# Patient Record
Sex: Female | Born: 1975 | Race: White | Hispanic: No | Smoking: Current some day smoker
Health system: Southern US, Community
[De-identification: ages and names within clinical notes are randomized; demographics above are authoritative.]

## PROBLEM LIST (undated history)

## (undated) DIAGNOSIS — J9811 Atelectasis: Secondary | ICD-10-CM

## (undated) DIAGNOSIS — L409 Psoriasis, unspecified: Secondary | ICD-10-CM

## (undated) HISTORY — PX: SPLENECTOMY: SUR1306

## (undated) HISTORY — DX: Psoriasis, unspecified: L40.9

---

## 2017-04-27 ENCOUNTER — Encounter (HOSPITAL_COMMUNITY): Payer: Self-pay | Admitting: Family Medicine

## 2017-04-27 ENCOUNTER — Emergency Department (HOSPITAL_COMMUNITY)
Admission: EM | Admit: 2017-04-27 | Discharge: 2017-04-27 | Disposition: A | Discharge status: Home / Self Care | Payer: Self-pay | Attending: Emergency Medicine | Admitting: Emergency Medicine

## 2017-04-27 DIAGNOSIS — B9689 Other specified bacterial agents as the cause of diseases classified elsewhere: Secondary | ICD-10-CM | POA: Insufficient documentation

## 2017-04-27 DIAGNOSIS — N760 Acute vaginitis: Secondary | ICD-10-CM | POA: Insufficient documentation

## 2017-04-27 DIAGNOSIS — F17200 Nicotine dependence, unspecified, uncomplicated: Secondary | ICD-10-CM | POA: Insufficient documentation

## 2017-04-27 HISTORY — DX: Atelectasis: J98.11

## 2017-04-27 LAB — WET PREP, GENITAL
SPERM: NONE SEEN
Trich, Wet Prep: NONE SEEN
Yeast Wet Prep HPF POC: NONE SEEN

## 2017-04-27 MED ORDER — METRONIDAZOLE 500 MG PO TABS: 500.0000 mg | ORAL_TABLET | Freq: Two times a day (BID) | ORAL | 0 refills | Status: DC

## 2017-04-27 NOTE — ED Triage Notes (Signed)
Patient reports she is experiencing vaginal discharge with an odor. Patient denies any abd pain, nausea, vomiting, diarrhea, or cramping. Symptoms started 2 days ago.

## 2017-04-27 NOTE — ED Provider Notes (Signed)
[facility]-EMERGENCY DEPT Provider Note   CSN: [id number] Arrival date & time: 04/27/17  1719     History   Chief Complaint Chief Complaint  Patient presents with  . Vaginal Discharge    HPI Sonia Warner is a 41 y.o. female who presents to the ED with vaginal discharge and odor. Patient had left over bactrim and started on it thinking that may treat BV.  The history is provided by the patient. No language interpreter was used.  Vaginal Discharge   This is a new problem. The current episode started 2 days ago. The problem occurs constantly. The problem has not changed since onset.The discharge occurs spontaneously. The discharge was thin and mucoid. She is not pregnant. Pertinent negatives include no fever, no abdominal pain, no nausea, no vomiting, no dysuria, no frequency, no genital burning, no genital itching and no genital lesions. Treatments tried: patient took Bactrim DS x 2 doses. Her past medical history is significant for PID and ovarian cysts. Her past medical history does not include ectopic pregnancy. STD: GC.    Past Medical History:  Diagnosis Date  . Collapse of left lung     There are no active problems to display for this patient.   Past Surgical History:  Procedure Laterality Date  . SPLENECTOMY      OB History    No data available       Home Medications    Prior to Admission medications   Medication Sig Start Date End Date Taking? Authorizing Provider  metroNIDAZOLE (FLAGYL) 500 MG tablet Take 1 tablet (500 mg total) by mouth 2 (two) times daily. 04/27/17   Janne NapoleonNeese, Hope M, NP    Family History History reviewed. No pertinent family history.  Social History Social History  Substance Use Topics  . Smoking status: Current Every Day Smoker    Packs/day: 0.25  . Smokeless tobacco: Never Used  . Alcohol use Yes     Comment: Once a month     Allergies   Patient has no allergy information on record.   Review of Systems Review of Systems    Constitutional: Negative for fever.  HENT: Negative.   Eyes: Negative for redness and itching.  Respiratory: Negative for cough, chest tightness and wheezing.   Cardiovascular: Negative for chest pain.  Gastrointestinal: Negative for abdominal pain, nausea and vomiting.  Genitourinary: Positive for vaginal discharge. Negative for dysuria and frequency.  Skin: Negative for rash.  Neurological: Negative for syncope and headaches.  Psychiatric/Behavioral: Nervous/anxious: hx of.      Physical Exam Updated Vital Signs BP 104/73 (BP Location: Left Arm)   Pulse 90   Temp 98.3 F (36.8 C) (Oral)   Resp 20   Wt 55.8 kg (123 lb)   LMP 04/23/2017   SpO2 99%   Physical Exam  Constitutional: She is oriented to person, place, and time. She appears well-developed and well-nourished. No distress.  HENT:  Head: Normocephalic.  Eyes: EOM are normal.  Neck: Neck supple.  Cardiovascular: Normal rate.   Pulmonary/Chest: Effort normal.  Abdominal: Soft. There is no tenderness.  Genitourinary:  Genitourinary Comments: External genitalia without lesions, frothy d/c vaginal vault. No CMT, no adnexal tenderness, uterus without palpable enlargement.   Musculoskeletal: Normal range of motion.  Neurological: She is alert and oriented to person, place, and time. No cranial nerve deficit.  Skin: Skin is warm and dry.  Psychiatric: She has a normal mood and affect.  Nursing note and vitals reviewed.    ED Treatments /  Results  Labs (all labs ordered are listed, but only abnormal results are displayed) Labs Reviewed  WET PREP, GENITAL - Abnormal; Notable for the following:       Result Value   Clue Cells Wet Prep HPF POC PRESENT (*)    WBC, Wet Prep HPF POC MANY (*)    All other components within normal limits  RPR  HIV ANTIBODY (ROUTINE TESTING)  GC/CHLAMYDIA PROBE AMP ([place]) NOT AT [facility]    Radiology No results found.  Procedures Procedures (including critical care  time)  Medications Ordered in ED Medications - No data to display   Initial Impression / Assessment and Plan / ED Course  I have reviewed the triage vital signs and the nursing notes. Pt presents with concerns due to vaginal d/c and odor.  Pt understands that they have GC/Chlamydia cultures pending and that they will need to inform all sexual partners if results return positive. Pt not concerning for PID because hemodynamically stable and no cervical motion tenderness on pelvic exam. Pt has also been treated with Flagyl for Bacterial Vaginosis. Pt has been advised to not drink alcohol while on this medication.  Patient to be discharged with instructions to follow up with OBGYN/PCP. Discussed importance of using protection when sexually active.   Final Clinical Impressions(s) / ED Diagnoses   Final diagnoses:  BV (bacterial vaginosis)    New Prescriptions New Prescriptions   METRONIDAZOLE (FLAGYL) 500 MG TABLET    Take 1 tablet (500 mg total) by mouth 2 (two) times daily.     Kerrie [place], [place] 04/27/17 2238    Jacalyn Lefevre, MD 04/27/17 2325

## 2017-04-27 NOTE — Discharge Instructions (Signed)
The cultures and blood results will not be back for 24 to 48 hours. You can look at your results in "MyChart". We will also call if any results are positive.

## 2017-04-28 LAB — GC/CHLAMYDIA PROBE AMP (~~LOC~~) NOT AT ARMC
CHLAMYDIA, DNA PROBE: NEGATIVE
NEISSERIA GONORRHEA: NEGATIVE

## 2017-04-28 LAB — HIV ANTIBODY (ROUTINE TESTING W REFLEX): HIV Screen 4th Generation wRfx: NONREACTIVE

## 2017-04-28 LAB — RPR: RPR Ser Ql: NONREACTIVE

## 2018-06-26 ENCOUNTER — Emergency Department (HOSPITAL_COMMUNITY)
Admission: EM | Admit: 2018-06-26 | Discharge: 2018-06-27 | Disposition: A | Discharge status: Home / Self Care | Payer: Self-pay | Attending: Emergency Medicine | Admitting: Emergency Medicine

## 2018-06-26 ENCOUNTER — Emergency Department (HOSPITAL_COMMUNITY): Payer: Self-pay

## 2018-06-26 ENCOUNTER — Other Ambulatory Visit: Payer: Self-pay

## 2018-06-26 DIAGNOSIS — F1910 Other psychoactive substance abuse, uncomplicated: Secondary | ICD-10-CM | POA: Insufficient documentation

## 2018-06-26 DIAGNOSIS — R7401 Elevation of levels of liver transaminase levels: Secondary | ICD-10-CM

## 2018-06-26 DIAGNOSIS — F17210 Nicotine dependence, cigarettes, uncomplicated: Secondary | ICD-10-CM | POA: Insufficient documentation

## 2018-06-26 DIAGNOSIS — D509 Iron deficiency anemia, unspecified: Secondary | ICD-10-CM | POA: Insufficient documentation

## 2018-06-26 DIAGNOSIS — R740 Nonspecific elevation of levels of transaminase and lactic acid dehydrogenase [LDH]: Secondary | ICD-10-CM | POA: Insufficient documentation

## 2018-06-26 DIAGNOSIS — F419 Anxiety disorder, unspecified: Secondary | ICD-10-CM | POA: Insufficient documentation

## 2018-06-26 MED ORDER — SODIUM CHLORIDE 0.9 % IV SOLN
INTRAVENOUS | Status: DC
  Administered 2018-06-27: via INTRAVENOUS

## 2018-06-26 MED ORDER — LORAZEPAM 1 MG PO TABS
2.0000 mg | ORAL_TABLET | Freq: Once | ORAL | Status: AC
  Administered 2018-06-27: 2 mg via ORAL
  Filled 2018-06-26: qty 2

## 2018-06-26 MED ORDER — SODIUM CHLORIDE 0.9 % IV BOLUS
2000.0000 mL | Freq: Once | INTRAVENOUS | Status: AC
  Administered 2018-06-27: 2000 mL via INTRAVENOUS

## 2018-06-26 NOTE — ED Triage Notes (Addendum)
[place] [facility] [place] with c/o of left leg. Pt states that her leg feels numbness and tingling for 12 hours. Pt also stated to [facility] that she shot Heroin in her right arm about 2 hours prior to leg pain. Pt is lethargic and pupils are pinpoint. Pt is A&O x 3 and ambulatory.

## 2018-06-26 NOTE — ED Notes (Signed)
RN at bedside

## 2018-06-26 NOTE — ED Provider Notes (Signed)
[facility]-EMERGENCY DEPT Provider Note   CSN: [id number] Arrival date & time: 06/26/18  1749     History   Chief Complaint Chief Complaint  Patient presents with  . Leg Pain    HPI Sonia Warner is a 42 y.o. female.  43 year old female presents with sudden onset of left leg tingling which is been persistent throughout the day.  States that now is gone to her left arm.  States he feels as if her leg went to sleep.  She denies any associated back pain.  No hip discomfort.  No bowel or bladder dysfunction.  No fever or chills.  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  Denies any psychiatric complaints.  She has not had any headaches.  Feels that she is dehydrated.  But denies any emesis.  No chronic use of alcohol.  No recent history of trauma.  States she feels very anxious at this time     Past Medical History:  Diagnosis Date  . Collapse of left lung     There are no active problems to display for this patient.   Past Surgical History:  Procedure Laterality Date  . SPLENECTOMY       OB History   None      Home Medications    Prior to Admission medications   Medication Sig Start Date End Date Taking? Authorizing Provider  metroNIDAZOLE (FLAGYL) 500 MG tablet Take 1 tablet (500 mg total) by mouth 2 (two) times daily. 04/27/17   Janne Napoleon, NP    Family History No family history on file.  Social History Social History   Tobacco Use  . Smoking status: Current Every Day Smoker    Packs/day: 0.25  . Smokeless tobacco: Never Used  Substance Use Topics  . Alcohol use: Yes    Comment: Once a month  . 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 Last used: Yesterday.      Allergies   Patient has no allergy information on record.   Review of Systems Review of Systems  All other systems reviewed and are negative.    Physical Exam Updated Vital Signs BP (!) 103/56   Pulse 98   Temp  98 F (36.7 C) (Oral)   Resp 18   Ht 1.702 m (5\' 7" )   Wt 59 kg   SpO2 96%   BMI 20.36 kg/m   Physical Exam  Constitutional: She is oriented to person, place, and time. She appears well-developed and well-nourished.  Non-toxic appearance. No distress.  HENT:  Head: Normocephalic and atraumatic.  Eyes: Pupils are equal, round, and reactive to light. Conjunctivae, EOM and lids are normal.  Neck: Normal range of motion. Neck supple. No tracheal deviation present. No thyroid mass present.  Cardiovascular: Normal rate, regular rhythm and normal heart sounds. Exam reveals no gallop.  No murmur heard. Pulmonary/Chest: Effort normal and breath sounds normal. No stridor. No respiratory distress. She has no decreased breath sounds. She has no wheezes. She has no rhonchi. She has no rales.  Abdominal: Soft. Normal appearance and bowel sounds are normal. She exhibits no distension. There is no tenderness. There is no rebound and no CVA tenderness.  Musculoskeletal: Normal range of motion. She exhibits no edema or tenderness.  Neurological: She is alert and oriented to person, place, and time. She has normal strength. She displays no tremor. No cranial nerve deficit or sensory deficit. Coordination normal. GCS eye subscore  is 4. GCS verbal subscore is 5. GCS motor subscore is 6.  Strength is 5 out of 5 in her upper as well as lower extremities.  No facial asymmetry noted.  Patient without foot drop when ambulating in the department.  Sensation is grossly normal bilaterally.  Skin: Skin is warm and dry. No abrasion and no rash noted.  Psychiatric: Her mood appears anxious. Her speech is rapid and/or pressured. She is hyperactive. She expresses no suicidal plans and no homicidal plans.  Nursing note and vitals reviewed.    ED Treatments / Results  Labs (all labs ordered are listed, but only abnormal results are displayed) Labs Reviewed - No data to display  EKG None  Radiology No results  found.  Procedures Procedures (including critical care time)  Medications Ordered in ED Medications  sodium chloride 0.9 % bolus 2,000 mL (has no administration in time range)  0.9 %  sodium chloride infusion (has no administration in time range)     Initial Impression / Assessment and Plan / ED Course  I have reviewed the triage vital signs and the nursing notes.  Pertinent labs & imaging results that were available during my care of the patient were reviewed by me and considered in my medical decision making (see chart for details).     Patient given IV fluids here.  She has no focal neurological deficits on exam.  Head CT without acute findings.  Labs are pending at this time.  Given Ativan for anxiety.  Case signed out to Dr. Read Drivers  Final Clinical Impressions(s) / ED Diagnoses   Final diagnoses:  None    ED Discharge Orders    None       Lorre Nick, MD 06/26/18 2356

## 2018-06-27 LAB — SEDIMENTATION RATE: Sed Rate: 3 mm/hr (ref 0–22)

## 2018-06-27 LAB — COMPREHENSIVE METABOLIC PANEL
ALBUMIN: 3.8 g/dL (ref 3.5–5.0)
ALT: 202 U/L — AB (ref 0–44)
AST: 203 U/L — ABNORMAL HIGH (ref 15–41)
Alkaline Phosphatase: 82 U/L (ref 38–126)
Anion gap: 6 (ref 5–15)
BUN: 16 mg/dL (ref 6–20)
CO2: 26 mmol/L (ref 22–32)
CREATININE: 0.7 mg/dL (ref 0.44–1.00)
Calcium: 9 mg/dL (ref 8.9–10.3)
Chloride: 102 mmol/L (ref 98–111)
GFR calc Af Amer: >60 mL/min (ref >60)
GFR calc non Af Amer: >60 mL/min (ref >60)
GLUCOSE: 93 mg/dL (ref 70–99)
Potassium: 3.9 mmol/L (ref 3.5–5.1)
SODIUM: 134 mmol/L — AB (ref 135–145)
Total Bilirubin: 0.6 mg/dL (ref 0.3–1.2)
Total Protein: 7.4 g/dL (ref 6.5–8.1)

## 2018-06-27 LAB — CBC WITH DIFFERENTIAL/PLATELET
ABS IMMATURE GRANULOCYTES: 0.09 10*3/uL — AB (ref 0.00–0.07)
BASOS ABS: 0.1 10*3/uL (ref 0.0–0.1)
Basophils Relative: 0 %
EOS ABS: 0.2 10*3/uL (ref 0.0–0.5)
EOS PCT: 1 %
HCT: 31.3 % — ABNORMAL LOW (ref 36.0–46.0)
HEMOGLOBIN: 9.3 g/dL — AB (ref 12.0–15.0)
Immature Granulocytes: 1 %
LYMPHS ABS: 2.6 10*3/uL (ref 0.7–4.0)
LYMPHS PCT: 15 %
MCH: 22.7 pg — AB (ref 26.0–34.0)
MCHC: 29.7 g/dL — ABNORMAL LOW (ref 30.0–36.0)
MCV: 76.5 fL — ABNORMAL LOW (ref 80.0–100.0)
MONOS PCT: 12 %
Monocytes Absolute: 2 10*3/uL — ABNORMAL HIGH (ref 0.1–1.0)
Neutro Abs: 12.1 10*3/uL — ABNORMAL HIGH (ref 1.7–7.7)
Neutrophils Relative %: 71 %
Platelets: 488 10*3/uL — ABNORMAL HIGH (ref 150–400)
RBC: 4.09 MIL/uL (ref 3.87–5.11)
RDW: 24.1 % — ABNORMAL HIGH (ref 11.5–15.5)
WBC: 16.9 10*3/uL — ABNORMAL HIGH (ref 4.0–10.5)
nRBC: 0 % (ref 0.0–0.2)

## 2018-06-27 LAB — I-STAT BETA HCG BLOOD, ED (MC, WL, AP ONLY): I-stat hCG, quantitative: <5 m[IU]/mL (ref <5)

## 2018-06-27 MED ORDER — FERROUS SULFATE 325 (65 FE) MG PO TABS: 325.0000 mg | ORAL_TABLET | Freq: Two times a day (BID) | ORAL | 0 refills | Status: DC

## 2018-06-27 NOTE — ED Notes (Signed)
Patient still refusing to leave. Told patient that she had been discharged and needed to leave. Previous nurse also told patient this and told her she would be escorted by security if she would not leave. Patient back in bed with blanket over her. Security called.

## 2018-06-27 NOTE — ED Provider Notes (Signed)
Nursing notes and vitals signs, including pulse oximetry, reviewed.  Summary of this visit's results, reviewed by myself:  EKG:  EKG Interpretation  Date/Time:    Ventricular Rate:    PR Interval:    QRS Duration:   QT Interval:    QTC Calculation:   R Axis:     Text Interpretation:         Labs:  Results for orders placed or performed during the hospital encounter of 06/26/18 (from the past 24 hour(s))  CBC with Differential/Platelet     Status: Abnormal   Collection Time: 06/27/18 12:10 AM  Result Value Ref Range   WBC 16.9 (H) 4.0 - 10.5 K/uL   RBC 4.09 3.87 - 5.11 MIL/uL   Hemoglobin 9.3 (L) 12.0 - 15.0 g/dL   HCT 16.131.3 (L) 09.636.0 - 04.546.0 %   MCV 76.5 (L) 80.0 - 100.0 fL   MCH 22.7 (L) 26.0 - 34.0 pg   MCHC 29.7 (L) 30.0 - 36.0 g/dL   RDW 40.924.1 (H) 81.111.5 - 91.415.5 %   Platelets 488 (H) 150 - 400 K/uL   nRBC 0.0 0.0 - 0.2 %   Neutrophils Relative % 71 %   Neutro Abs 12.1 (H) 1.7 - 7.7 K/uL   Lymphocytes Relative 15 %   Lymphs Abs 2.6 0.7 - 4.0 K/uL   Monocytes Relative 12 %   Monocytes Absolute 2.0 (H) 0.1 - 1.0 K/uL   Eosinophils Relative 1 %   Eosinophils Absolute 0.2 0.0 - 0.5 K/uL   Basophils Relative 0 %   Basophils Absolute 0.1 0.0 - 0.1 K/uL   RBC Morphology ELLIPTOCYTES PRESENT    Immature Granulocytes 1 %   Abs Immature Granulocytes 0.09 (H) 0.00 - 0.07 K/uL   Target Cells PRESENT   Comprehensive metabolic panel     Status: Abnormal   Collection Time: 06/27/18 12:10 AM  Result Value Ref Range   Sodium 134 (L) 135 - 145 mmol/L   Potassium 3.9 3.5 - 5.1 mmol/L   Chloride 102 98 - 111 mmol/L   CO2 26 22 - 32 mmol/L   Glucose, Bld 93 70 - 99 mg/dL   BUN 16 6 - 20 mg/dL   Creatinine, Ser 7.820.70 0.44 - 1.00 mg/dL   Calcium 9.0 8.9 - 95.610.3 mg/dL   Total Protein 7.4 6.5 - 8.1 g/dL   Albumin 3.8 3.5 - 5.0 g/dL   AST 213203 (H) 15 - 41 U/L   ALT 202 (H) 0 - 44 U/L   Alkaline Phosphatase 82 38 - 126 U/L   Total Bilirubin 0.6 0.3 - 1.2 mg/dL   GFR calc non Af Amer  >60 >60 mL/min   GFR calc Af Amer >60 >60 mL/min   Anion gap 6 5 - 15  Sedimentation rate     Status: None   Collection Time: 06/27/18 12:10 AM  Result Value Ref Range   Sed Rate 3 0 - 22 mm/hr  I-Stat beta hCG blood, ED ([facility], [facility], [facility] only)     Status: None   Collection Time: 06/27/18 12:20 AM  Result Value Ref Range   I-stat hCG, quantitative <5.0 <5 mIU/mL   Comment 3            Imaging Studies: Ct Head Wo Contrast  Result Date: 06/26/2018 CLINICAL DATA:  Altered LOC numb left leg EXAM: CT HEAD WITHOUT CONTRAST TECHNIQUE: Contiguous axial images were obtained from the base of the skull through the vertex without intravenous contrast. COMPARISON:  None. FINDINGS:  Brain: No evidence of acute infarction, hemorrhage, hydrocephalus, extra-axial collection or mass lesion/mass effect. Vascular: No hyperdense vessel or unexpected calcification. Skull: Normal. Negative for fracture or focal lesion. Sinuses/Orbits: Mucosal thickening in the ethmoid and maxillary sinuses. Other: None IMPRESSION: Negative non contrasted CT appearance of the brain. Electronically Signed   By: Jasmine Pang M.D.   On: 06/26/2018 23:13      Sharnell Knight, Jonny Ruiz, MD 06/27/18 [id number]

## 2018-06-27 NOTE — ED Notes (Signed)
Patient escorted out by security

## 2018-09-30 ENCOUNTER — Emergency Department (HOSPITAL_COMMUNITY): Payer: Self-pay

## 2018-09-30 ENCOUNTER — Emergency Department (HOSPITAL_COMMUNITY)
Admission: EM | Admit: 2018-09-30 | Discharge: 2018-09-30 | Disposition: A | Discharge status: Home / Self Care | Payer: Self-pay | Attending: Emergency Medicine | Admitting: Emergency Medicine

## 2018-09-30 ENCOUNTER — Other Ambulatory Visit: Payer: Self-pay

## 2018-09-30 ENCOUNTER — Encounter (HOSPITAL_COMMUNITY): Payer: Self-pay | Admitting: Emergency Medicine

## 2018-09-30 DIAGNOSIS — Y929 Unspecified place or not applicable: Secondary | ICD-10-CM | POA: Insufficient documentation

## 2018-09-30 DIAGNOSIS — Y9351 Activity, roller skating (inline) and skateboarding: Secondary | ICD-10-CM | POA: Insufficient documentation

## 2018-09-30 DIAGNOSIS — S0993XA Unspecified injury of face, initial encounter: Secondary | ICD-10-CM

## 2018-09-30 DIAGNOSIS — Y999 Unspecified external cause status: Secondary | ICD-10-CM | POA: Insufficient documentation

## 2018-09-30 DIAGNOSIS — S00502A Unspecified superficial injury of oral cavity, initial encounter: Secondary | ICD-10-CM | POA: Insufficient documentation

## 2018-09-30 DIAGNOSIS — Z79899 Other long term (current) drug therapy: Secondary | ICD-10-CM | POA: Insufficient documentation

## 2018-09-30 DIAGNOSIS — S02609A Fracture of mandible, unspecified, initial encounter for closed fracture: Secondary | ICD-10-CM

## 2018-09-30 DIAGNOSIS — F17210 Nicotine dependence, cigarettes, uncomplicated: Secondary | ICD-10-CM | POA: Insufficient documentation

## 2018-09-30 DIAGNOSIS — S0181XA Laceration without foreign body of other part of head, initial encounter: Secondary | ICD-10-CM

## 2018-09-30 MED ORDER — LIDOCAINE HCL (PF) 1 % IJ SOLN
10.0000 mL | Freq: Once | INTRAMUSCULAR | Status: AC
  Administered 2018-09-30: 10 mL
  Filled 2018-09-30: qty 10

## 2018-09-30 MED ORDER — LIDOCAINE-EPINEPHRINE-TETRACAINE (LET) SOLUTION
3.0000 mL | Freq: Once | NASAL | Status: AC
  Administered 2018-09-30: 3 mL via TOPICAL
  Filled 2018-09-30: qty 3
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
## 2018-09-30 NOTE — Discharge Instructions (Addendum)
Please follow-up for suture removal at either urgent care ,the emergency department, or your primary care doctor in 5 days.  Please follow-up with a dentist and a maxillofacial surgeon in regards to your injuries today.  Please return to the emergency room immediately if you experience any new or worsening symptoms or any symptoms that indicate worsening infection such as fevers, increased redness/swelling/pain, warmth, or drainage from the affected area.
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
## 2019-09-17 ENCOUNTER — Telehealth: Payer: Self-pay | Admitting: Pharmacy Technician

## 2019-09-17 ENCOUNTER — Other Ambulatory Visit: Payer: Self-pay

## 2019-09-17 ENCOUNTER — Encounter: Payer: Self-pay | Admitting: Family

## 2019-09-17 ENCOUNTER — Ambulatory Visit (INDEPENDENT_AMBULATORY_CARE_PROVIDER_SITE_OTHER): Payer: Self-pay | Admitting: Family

## 2019-09-17 VITALS — BP 109/72 | HR 83 | Temp 98.5°F | Ht 67.0 in | Wt 133.0 lb

## 2019-09-17 DIAGNOSIS — B182 Chronic viral hepatitis C: Secondary | ICD-10-CM | POA: Insufficient documentation

## 2019-09-17 DIAGNOSIS — F1910 Other psychoactive substance abuse, uncomplicated: Secondary | ICD-10-CM | POA: Insufficient documentation

## 2019-09-17 DIAGNOSIS — R768 Other specified abnormal immunological findings in serum: Secondary | ICD-10-CM

## 2019-09-17 NOTE — Telephone Encounter (Signed)
[facility] Patient Advocate Encounter ° °Insurance verification completed.   ° °The patient is uninsured and will need patient assistance for medication. ° °We can complete the application and will need to meet with the patient for signatures and income documentation. ° °Sonia Warner, CPhT °Specialty Pharmacy Patient Advocate °[facility] °Phone: [phone] °Fax:  [phone] ° ° °

## 2019-09-17 NOTE — Assessment & Plan Note (Signed)
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 Discussed importance of reducing use and seeking counseling through [facility] or independently as this increases risk for treatment failure. Will continue to monitor.
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
## 2019-09-17 NOTE — Assessment & Plan Note (Signed)
Sonia Warner is a 43 y/o female with positive Hepatitis C antibody test and likely chronic Hepatitis C. She is treatment naive and asymptomatic. She does continue to use methamphetamine but has remained sober from heroin for the past year. We discussed the nature of Hepatitis C infection including the transmission, prevention, risks if left untreated, and treatment options. Will check blood work today including Hepatitis B status, HIV status, and liver function. She is currently uninsured and will require financial assistance. She met with our pharmacy staff for medication assistance. Plan for Mavyret or Epclusa pending blood work results.  

## 2019-09-17 NOTE — Patient Instructions (Signed)
Nice to meet you.  We will check your blood work today and let you know the results.  Please apply for [facility] financial assistance and [facility] financial assistance.   Once we get your results we will get you started on treatment.   Limit acetaminophen (Tylenol) usage to no more than 2 grams (2,000 mg) per day.  Avoid alcohol.  Do not share toothbrushes or razors.  Practice safe sex to protect against transmission as well as sexually transmitted disease.    Hepatitis C Hepatitis C is a viral infection of the liver. It can lead to scarring of the liver (cirrhosis), liver failure, or liver cancer. Hepatitis C may go undetected for months or years because people with the infection may not have symptoms, or they may have only mild symptoms. What are the causes? This condition is caused by the hepatitis C virus (HCV). The virus can spread from person to person (is contagious) through:  Blood.  Childbirth. A woman who has hepatitis C can pass it to her baby during birth.  Bodily fluids, such as breast milk, tears, semen, vaginal fluids, and saliva.  Blood transfusions or organ transplants done in the [place] before 1992.  What increases the risk? The following factors may make you more likely to develop this condition:  Having contact with unclean (contaminated) needles or syringes. This may result from: ? Acupuncture. ? Tattoing. ? Body piercing. ? Injecting drugs.  Having unprotected sex with someone who is infected.  Needing treatment to filter your blood (kidney dialysis).  Having HIV (human immunodeficiency virus) or AIDS (acquired immunodeficiency syndrome).  Working in a job that involves contact with blood or bodily fluids, such as health care.  What are the signs or symptoms? Symptoms of this condition include:  Fatigue.  Loss of appetite.  Nausea.  Vomiting.  Abdominal pain.  Dark yellow urine.  Yellowish skin and eyes (jaundice).  Itchy  skin.  Clay-colored bowel movements.  Joint pain.  Bleeding and bruising easily.  Fluid building up in your stomach (ascites).  In some cases, you may not have any symptoms. How is this diagnosed? This condition is diagnosed with:  Blood tests.  Other tests to check how well your liver is functioning. They may include: ? Magnetic resonance elastography (MRE). This imaging test uses MRIs and sound waves to measure liver stiffness. ? Transient elastography. This imaging test uses ultrasounds to measure liver stiffness. ? Liver biopsy. This test requires taking a small tissue sample from your liver to examine it under a microscope.  How is this treated? Your health care provider may perform noninvasive tests or a liver biopsy to help decide the best course of treatment. Treatment may include:  Antiviral medicines and other medicines.  Follow-up treatments every 6-12 months for infections or other liver conditions.  Receiving a donated liver (liver transplant).  Follow these instructions at home: Medicines  Take over-the-counter and prescription medicines only as told by your health care provider.  Take your antiviral medicine as told by your health care provider. Do not stop taking the antiviral even if you start to feel better.  Do not take any medicines unless approved by your health care provider, including over-the-counter medicines and birth control pills. Activity  Rest as needed.  Do not have sex unless approved by your health care provider.  Ask your health care provider when you may return to school or work. Eating and drinking  Eat a balanced diet with plenty of fruits and vegetables,  whole grains, and lowfat (lean) meats or non-meat proteins (such as beans or tofu).  Drink enough fluids to keep your urine clear or pale yellow.  Do not drink alcohol. General instructions  Do not share toothbrushes, nail clippers, or razors.  Wash your hands frequently  with soap and water. If soap and water are not available, use hand sanitizer.  Cover any cuts or open sores on your skin to prevent spreading the virus.  Keep all follow-up visits as told by your health care provider. This is important. You may need follow-up visits every 6-12 months. How is this prevented? There is no vaccine for hepatitis C. The only way to prevent the disease is to reduce the risk of exposure to the virus. Make sure you:  Wash your hands frequently with soap and water. If soap and water are not available, use hand sanitizer.  Do not share needles or syringes.  Practice safe sex and use condoms.  Avoid handling blood or bodily fluids without gloves or other protection.  Avoid getting tattoos or piercings in shops or other locations that are not clean.  Contact a health care provider if:  You have a fever.  You develop abdominal pain.  You pass dark urine.  You pass clay-colored stools.  You develop joint pain. Get help right away if:  You have increasing fatigue or weakness.  You lose your appetite.  You cannot eat or drink without vomiting.  You develop jaundice or your jaundice gets worse.  You bruise or bleed easily. Summary  Hepatitis C is a viral infection of the liver. It can lead to scarring of the liver (cirrhosis), liver failure, or liver cancer.  The hepatitis C virus (HCV) causes this condition. The virus can pass from person to person (is contagious).  You should not take any medicines unless approved by your health care provider. This includes over-the-counter medicines and birth control pills. This information is not intended to replace advice given to you by your health care provider. Make sure you discuss any questions you have with your health care provider. Document Released: 07/30/2000 Document Revised: 09/07/2016 Document Reviewed: 09/07/2016 Elsevier Interactive Patient Education  [employer].

## 2019-09-17 NOTE — Telephone Encounter (Signed)
Obtained needed signatures and income documentation should a Hepatitis C medication be needed.  Netty Starring. Dimas Aguas CPhT Specialty Pharmacy Patient [facility] for Infectious Disease Phone: [phone] Fax:  [phone]

## 2019-09-20 LAB — BASIC METABOLIC PANEL
BUN: 13 mg/dL (ref 7–25)
CO2: 25 mmol/L (ref 20–32)
Calcium: 9.7 mg/dL (ref 8.6–10.2)
Chloride: 106 mmol/L (ref 98–110)
Creat: 0.59 mg/dL (ref 0.50–1.10)
Glucose, Bld: 86 mg/dL (ref 65–99)
Potassium: 3.8 mmol/L (ref 3.5–5.3)
Sodium: 138 mmol/L (ref 135–146)

## 2019-09-20 LAB — HEPATIC FUNCTION PANEL
AG Ratio: 1.2 (calc) (ref 1.0–2.5)
ALT: 132 U/L — ABNORMAL HIGH (ref 6–29)
AST: 109 U/L — ABNORMAL HIGH (ref 10–35)
Albumin: 4 g/dL (ref 3.6–5.1)
Alkaline phosphatase (APISO): 69 U/L (ref 31–125)
Bilirubin, Direct: 0.1 mg/dL (ref 0.0–0.2)
Globulin: 3.4 g/dL (calc) (ref 1.9–3.7)
Indirect Bilirubin: 0.2 mg/dL (calc) (ref 0.2–1.2)
Total Bilirubin: 0.3 mg/dL (ref 0.2–1.2)
Total Protein: 7.4 g/dL (ref 6.1–8.1)

## 2019-09-20 LAB — HEPATITIS B SURFACE ANTIBODY,QUALITATIVE: Hep B S Ab: NONREACTIVE

## 2019-09-20 LAB — LIVER FIBROSIS, FIBROTEST-ACTITEST
ALT: 131 U/L — ABNORMAL HIGH (ref 6–29)
Alpha-2-Macroglobulin: 197 mg/dL (ref 106–279)
Apolipoprotein A1: 135 mg/dL (ref 101–198)
Bilirubin: 0.3 mg/dL (ref 0.2–1.2)
Fibrosis Score: 0.23
GGT: 58 U/L — ABNORMAL HIGH (ref 3–55)
Haptoglobin: 62 mg/dL (ref 43–212)
Necroinflammat ACT Score: 0.65
Reference ID: 3254851

## 2019-09-20 LAB — CBC
HCT: 31.8 % — ABNORMAL LOW (ref 35.0–45.0)
Hemoglobin: 10.5 g/dL — ABNORMAL LOW (ref 11.7–15.5)
MCH: 27.3 pg (ref 27.0–33.0)
MCHC: 33 g/dL (ref 32.0–36.0)
MCV: 82.6 fL (ref 80.0–100.0)
MPV: 10.9 fL (ref 7.5–12.5)
Platelets: 453 10*3/uL — ABNORMAL HIGH (ref 140–400)
RBC: 3.85 10*6/uL (ref 3.80–5.10)
RDW: 15 % (ref 11.0–15.0)
WBC: 6 10*3/uL (ref 3.8–10.8)

## 2019-09-20 LAB — PROTIME-INR
INR: 1
Prothrombin Time: 10.7 s (ref 9.0–11.5)

## 2019-09-20 LAB — HEPATITIS C RNA QUANTITATIVE
HCV Quantitative Log: 6.87 Log IU/mL — ABNORMAL HIGH
HCV RNA, PCR, QN: 7400000 IU/mL — ABNORMAL HIGH

## 2019-09-20 LAB — HEPATITIS B SURFACE ANTIGEN: Hepatitis B Surface Ag: NONREACTIVE

## 2019-09-20 LAB — HIV ANTIBODY (ROUTINE TESTING W REFLEX): HIV 1&2 Ab, 4th Generation: NONREACTIVE

## 2019-09-20 LAB — HEPATITIS C GENOTYPE: HCV Genotype: 3

## 2019-10-15 ENCOUNTER — Telehealth: Payer: Self-pay | Admitting: Pharmacy Technician

## 2019-10-15 ENCOUNTER — Ambulatory Visit (INDEPENDENT_AMBULATORY_CARE_PROVIDER_SITE_OTHER): Payer: Self-pay | Admitting: Family

## 2019-10-15 ENCOUNTER — Encounter: Payer: Self-pay | Admitting: Family

## 2019-10-15 ENCOUNTER — Other Ambulatory Visit: Payer: Self-pay

## 2019-10-15 DIAGNOSIS — B182 Chronic viral hepatitis C: Secondary | ICD-10-CM

## 2019-10-15 NOTE — Telephone Encounter (Addendum)
[facility] Patient Advocate Encounter  Completed and sent [facility] application for Mavyret for this patient who is uninsured.    Status is Approved.  [facility] pharmacy will send the medication directly to the patient's home.  We will follow up for future appointments.  Netty Starring. Dimas Aguas CPhT Specialty Pharmacy Patient [facility] for Infectious Disease Phone: [phone] Fax:  [phone]
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
## 2019-10-15 NOTE — Assessment & Plan Note (Signed)
Ms. Sonia Warner has genotype 3 chronic hepatitis C with a viral load of 7.4 million and fibrosis score of F0 to F1.  We reviewed her lab work and discussed the plan of care.  She remains asymptomatic.  Start Mavyret.  She was able to speak with our pharmacy staff today regarding proper administration of Mavyret.  We will treat for 8 weeks and plan for follow-up in 4 weeks after starting treatment for recheck of viral load.

## 2019-10-15 NOTE — Patient Instructions (Signed)
Nice to see you.  We will get you started on Mavyret.  Plan for follow up in 1 month following starting medication with Cassie.  Have a great day and stay safe!

## 2019-10-16 MED ORDER — MAVYRET 100-40 MG PO TABS: 3.0000 | ORAL_TABLET | Freq: Every day | ORAL | 1 refills | Status: DC

## 2019-10-16 NOTE — Addendum Note (Signed)
Addended by: Robinette Haines on: 10/16/2019 02:31 PM   Modules accepted: Orders

## 2019-10-16 NOTE — Progress Notes (Signed)
HPI: Sonia Warner is a 44 y.o. female who presents to the [facility] pharmacy clinic for Hepatitis C follow-up.  Medication: has not started any yet  Start Date: TBD  Hepatitis C Genotype: 3  Fibrosis Score: F0/F1  Hepatitis C RNA: 7.4 million on 09/17/2019  Patient Active Problem List   Diagnosis Date Noted  . Chronic hepatitis C without hepatic coma (HCC) 09/17/2019  . Polysubstance abuse (HCC) 09/17/2019    Patient's Medications  New Prescriptions   No medications on file  Previous Medications   FERROUS SULFATE 325 (65 FE) MG TABLET    Take 1 tablet (325 mg total) by mouth 2 (two) times daily with a meal.   MULTIPLE VITAMINS-MINERALS (MULTIVITAMIN ADULT, MINERALS, PO)    Take by mouth.  Modified Medications   No medications on file  Discontinued Medications   No medications on file    Allergies: No Known Allergies  Past Medical History: Past Medical History:  Diagnosis Date  . Collapse of left lung   . Psoriasis     Social History: Social History   Socioeconomic History  . Marital status: Divorced    Spouse name: Not on file  . Number of children: Not on file  . Years of education: Not on file  . Highest education level: Not on file  Occupational History  . Not on file  Tobacco Use  . Smoking status: Current Every Day Smoker    Packs/day: 0.25    Years: 30.00    Pack years: 7.50  . Smokeless tobacco: Never Used  Substance and Sexual Activity  . Alcohol use: Yes    Comment: 1-2  month / Occasional  . Drug use: Yes    Types: Heroin, Methamphetamines    Comment: Heroin and methamphetamine   . Sexual activity: Not on file  Other Topics Concern  . Not on file  Social History Narrative  . Not on file   Social Determinants of Health   Financial Resource Strain:   . Difficulty of Paying Living Expenses: Not on file  Food Insecurity:   . Worried About Programme researcher, broadcasting/film/video in the Last Year: Not on file  . Ran Out of Food in the Last Year: Not on file   Transportation Needs:   . Lack of Transportation (Medical): Not on file  . Lack of Transportation (Non-Medical): Not on file  Physical Activity:   . Days of Exercise per Week: Not on file  . Minutes of Exercise per Session: Not on file  Stress:   . Feeling of Stress : Not on file  Social Connections:   . Frequency of Communication with Friends and Family: Not on file  . Frequency of Social Gatherings with Friends and Family: Not on file  . Attends Religious Services: Not on file  . Active Member of Clubs or Organizations: Not on file  . Attends Banker Meetings: Not on file  . Marital Status: Not on file    Labs: Hepatitis C Lab Results  Component Value Date   HCVGENOTYPE 3 09/17/2019   HCVRNAPCRQN 7,400,000 (H) 09/17/2019   FIBROSTAGE F0-F1 09/17/2019   Hepatitis B Lab Results  Component Value Date   HEPBSAB NON-REACTIVE 09/17/2019   HEPBSAG NON-REACTIVE 09/17/2019   Hepatitis A No results found for: HAV HIV Lab Results  Component Value Date   HIV NON-REACTIVE 09/17/2019   HIV Non Reactive 04/27/2017   Lab Results  Component Value Date   CREATININE 0.59 09/17/2019   CREATININE 0.70  06/27/2018   Lab Results  Component Value Date   AST 109 (H) 09/17/2019   AST 203 (H) 06/27/2018   ALT 132 (H) 09/17/2019   ALT 131 (H) 09/17/2019   ALT 202 (H) 06/27/2018   INR 1.0 09/17/2019    Assessment: Sonia Warner is here today to follow up with Sonia Warner for her chronic Hepatitis C infection.  All of her labs have returned and she will be started on Mavyret for 8 weeks through patient assistance.  Counseled patient to take all three tablets of Mavyret daily with food.  Counseled patient the need to take all three tablets together and to not separate them out during the day. Encouraged patient not to miss any doses and explained how their chance of cure could go down with each dose missed. Counseled patient on what to do if dose is missed - if it is closer to the missed  dose take immediately; if closer to next dose then skip dose and take the next dose at the usual time.   Counseled patient on common side effects such as headache, fatigue, and nausea and that these normally decrease with time. I reviewed patient medications and found no drug interactions. Discussed with patient that there are several drug interactions with Mavyret and instructed patient to call the clinic if she wishes to start a new medication during course of therapy. Also advised patient to call if she experiences any side effects. Patient will follow-up with me in the pharmacy clinic 4 weeks after starting treatment.   Plan: - Start Mavyret x 8 weeks once approved  Sonia Warner, PharmD, BCIDP, AAHIVP, CPP Clinical Pharmacist Practitioner [place] [facility] for Infectious Disease 10/16/2019, 2:28 PM

## 2019-10-24 NOTE — Telephone Encounter (Signed)
[facility] Patient Advocate Encounter  Spoke with Valla Leaver and they have tried multiple times to reach the patient unsuccessfully.  They are wanting to set up shipment of Mavyret to her home and will not be able to do so until they speak with her.  They will send her a letter asking for additional information.  I have tried to reach her also.   Netty Starring. Dimas Aguas CPhT Specialty Pharmacy Patient [facility] for Infectious Disease Phone: [phone] Fax:  [phone]

## 2019-11-01 ENCOUNTER — Telehealth: Payer: Self-pay | Admitting: Pharmacy Technician

## 2019-11-01 NOTE — Telephone Encounter (Signed)
[facility] Patient Advocate Encounter  Patient called in this afternoon to find out the steps to request her Mavyret medication. She received the calls from [facility] but was unable to connect back with them. She is no longer at the [facility] address and would like the medication shipped to the clinic for pick up. I confirmed her phone number as [phone].   Medication is estimated to arrive to the clinic Thursday, April 1st. Patient will be updated of this new timeline.

## 2019-11-19 NOTE — Telephone Encounter (Signed)
[facility] Patient Advocate Encounter  Patient's medication, Mavyret has been sent to [facility] and is ready for pick up.  A HIPPA compliant voicemail was left asking her to call and we can update her.  Netty Starring. Dimas Aguas CPhT [facility] Patient [facility] Phone: [phone] Fax:  [phone]

## 2020-01-08 ENCOUNTER — Other Ambulatory Visit: Payer: Self-pay

## 2020-01-08 ENCOUNTER — Encounter (HOSPITAL_BASED_OUTPATIENT_CLINIC_OR_DEPARTMENT_OTHER): Payer: Self-pay | Admitting: *Deleted

## 2020-01-08 ENCOUNTER — Emergency Department (HOSPITAL_BASED_OUTPATIENT_CLINIC_OR_DEPARTMENT_OTHER)
Admission: EM | Admit: 2020-01-08 | Discharge: 2020-01-08 | Disposition: A | Discharge status: Home / Self Care | Payer: Self-pay | Attending: Emergency Medicine | Admitting: Emergency Medicine

## 2020-01-08 DIAGNOSIS — K089 Disorder of teeth and supporting structures, unspecified: Secondary | ICD-10-CM | POA: Insufficient documentation

## 2020-01-08 DIAGNOSIS — F17210 Nicotine dependence, cigarettes, uncomplicated: Secondary | ICD-10-CM | POA: Insufficient documentation

## 2020-01-08 DIAGNOSIS — K0889 Other specified disorders of teeth and supporting structures: Secondary | ICD-10-CM

## 2020-01-08 MED ORDER — AMOXICILLIN 500 MG PO CAPS: 500.0000 mg | ORAL_CAPSULE | Freq: Three times a day (TID) | ORAL | 0 refills | Status: DC

## 2020-01-08 MED ORDER — AMOXICILLIN 500 MG PO CAPS
500.0000 mg | ORAL_CAPSULE | Freq: Once | ORAL | Status: AC
  Administered 2020-01-08: 500 mg via ORAL
  Filled 2020-01-08: qty 1

## 2020-01-08 NOTE — ED Provider Notes (Signed)
[facility] EMERGENCY DEPARTMENT Provider Note   CSN: [id number] Arrival date & time: 01/08/20  0004     History Chief Complaint  Patient presents with  . Dental Pain    Sonia Warner is a 44 y.o. female.  Patient presents to the emergency department for evaluation of dental pain.  Patient has had 2 days of pain on the right lower side of her jaw.  No associated fever, facial swelling.  No drainage.        Past Medical History:  Diagnosis Date  . Collapse of left lung   . Psoriasis     Patient Active Problem List   Diagnosis Date Noted  . Chronic hepatitis C without hepatic coma ([place]) 09/17/2019  . Polysubstance abuse ([place]) 09/17/2019    Past Surgical History:  Procedure Laterality Date  . SPLENECTOMY       OB History   No obstetric history on file.     Family History  Problem Relation Age of Onset  . Hypertension Mother   . Stroke Father     Social History   Tobacco Use  . Smoking status: Current Every Day Smoker    Packs/day: 0.25    Years: 30.00    Pack years: 7.50  . Smokeless tobacco: Never Used  Substance Use Topics  . Alcohol use: Yes    Comment: 1-2  month / Occasional  . Drug use: Yes    Types: Heroin, Methamphetamines    Comment: Heroin and methamphetamine     Home Medications Prior to Admission medications   Medication Sig Start Date End Date Taking? Authorizing Provider  amoxicillin (AMOXIL) 500 MG capsule Take 1 capsule (500 mg total) by mouth 3 (three) times daily. 01/08/20   Orpah Greek, MD  ferrous sulfate 325 (65 FE) MG tablet Take 1 tablet (325 mg total) by mouth 2 (two) times daily with a meal. Patient not taking: Reported on 10/15/2019 06/27/18   Molpus, John, MD  Glecaprevir-Pibrentasvir (MAVYRET) 100-40 MG TABS Take 3 tablets by mouth daily with breakfast. 10/16/19   Golden Circle, FNP  Multiple Vitamins-Minerals (MULTIVITAMIN ADULT, MINERALS, PO) Take by mouth.    [provider]     Allergies    Patient has no known allergies.  Review of Systems   Review of Systems  Constitutional: Negative for fever.  HENT: Positive for dental problem.     Physical Exam Updated Vital Signs BP 111/75   Pulse 82   Temp 98.5 F (36.9 C)   Resp 18   Ht 5\' 7"  (1.702 m)   Wt 55.8 kg   LMP 12/25/2019   SpO2 97%   BMI 19.26 kg/m   Physical Exam Vitals and nursing note reviewed.  Constitutional:      General: She is not in acute distress.    Appearance: Normal appearance. She is well-developed.  HENT:     Head: Normocephalic and atraumatic.     Right Ear: Hearing normal.     Left Ear: Hearing normal.     Nose: Nose normal.     Mouth/Throat:     Dentition: Dental tenderness and dental caries present. No dental abscesses.   Eyes:     Conjunctiva/sclera: Conjunctivae normal.     Pupils: Pupils are equal, round, and reactive to light.  Cardiovascular:     Rate and Rhythm: Regular rhythm.     Heart sounds: S1 normal and S2 normal. No murmur. No friction rub. No gallop.   Pulmonary:  Effort: Pulmonary effort is normal. No respiratory distress.     Breath sounds: Normal breath sounds.  Chest:     Chest wall: No tenderness.  Abdominal:     General: Bowel sounds are normal.     Palpations: Abdomen is soft.     Tenderness: There is no abdominal tenderness. There is no guarding or rebound. Negative signs include Murphy's sign and McBurney's sign.     Hernia: No hernia is present.  Musculoskeletal:        General: Normal range of motion.     Cervical back: Normal range of motion and neck supple.  Skin:    General: Skin is warm and dry.     Findings: No rash.  Neurological:     Mental Status: She is alert and oriented to person, place, and time.     GCS: GCS eye subscore is 4. GCS verbal subscore is 5. GCS motor subscore is 6.     Cranial Nerves: No cranial nerve deficit.     Sensory: No sensory deficit.     Coordination: Coordination normal.  Psychiatric:         Speech: Speech normal.        Behavior: Behavior normal.        Thought Content: Thought content normal.     ED Results / Procedures / Treatments   Labs (all labs ordered are listed, but only abnormal results are displayed) Labs Reviewed - No data to display  EKG None  Radiology No results found.  Procedures Procedures (including critical care time)  Medications Ordered in ED Medications  amoxicillin (AMOXIL) capsule 500 mg (has no administration in time range)    ED Course  I have reviewed the triage vital signs and the nursing notes.  Pertinent labs & imaging results that were available during my care of the patient were reviewed by me and considered in my medical decision making (see chart for details).    MDM Rules/Calculators/A&P                      Patient does have multiple dental caries with tenderness to percussion and associated dental pain in the right lower jaw.  She does not have any fluctuant or drainable abscess, no facial swelling or fever.  Will initiate empiric antibiotic coverage, needs to follow-up with dentist.  Final Clinical Impression(s) / ED Diagnoses Final diagnoses:  Pain, dental    Rx / DC Orders ED Discharge Orders         Ordered    amoxicillin (AMOXIL) 500 MG capsule  3 times daily     01/08/20 0024           Gilda Crease, MD 01/08/20 [phone]

## 2020-01-08 NOTE — ED Triage Notes (Signed)
Pt c/o dental pain x 2 days.  
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
## 2020-01-28 IMAGING — DX DG HAND COMPLETE 3+V*L*
3 series · 3 of 3 positions shown · non-contrast
Comparison: None.

CLINICAL DATA: No evidence of fracture of the carpal or metacarpal
bones. Radiocarpal joint is intact. Phalanges are normal. No soft
tissue injury.

EXAM:
LEFT HAND - COMPLETE 3+ VIEW

[hand pa]
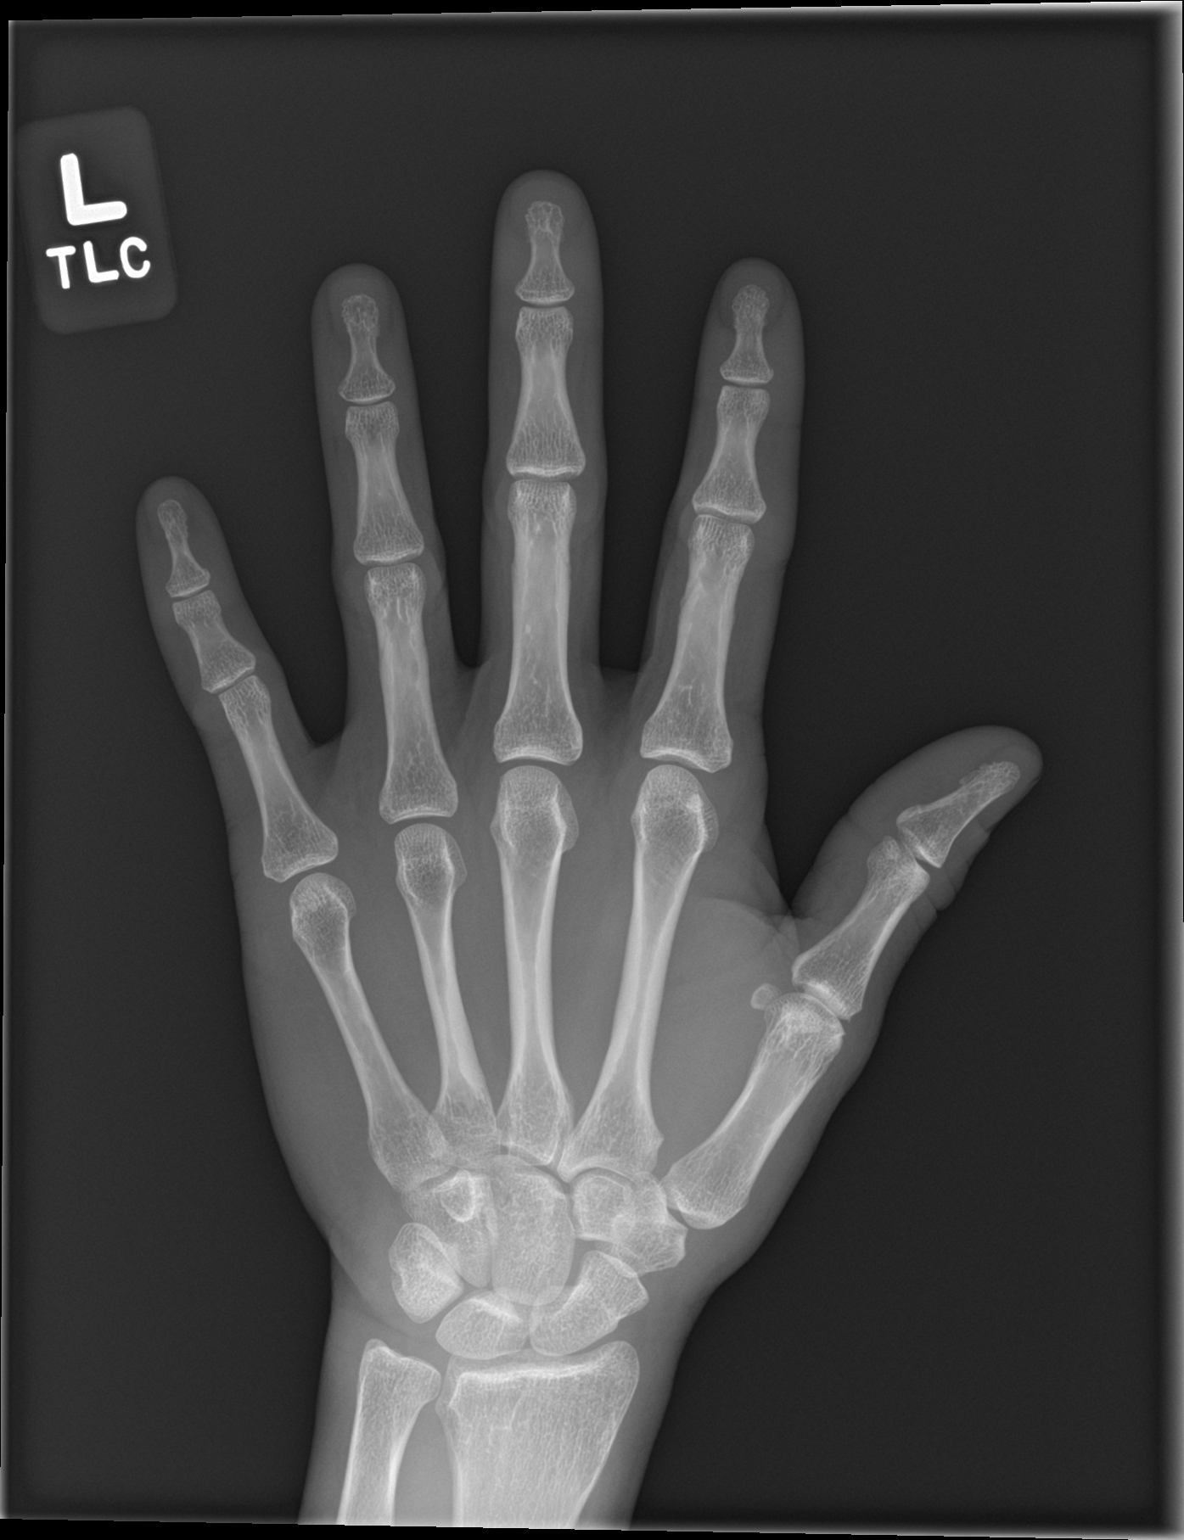

[hand obl]
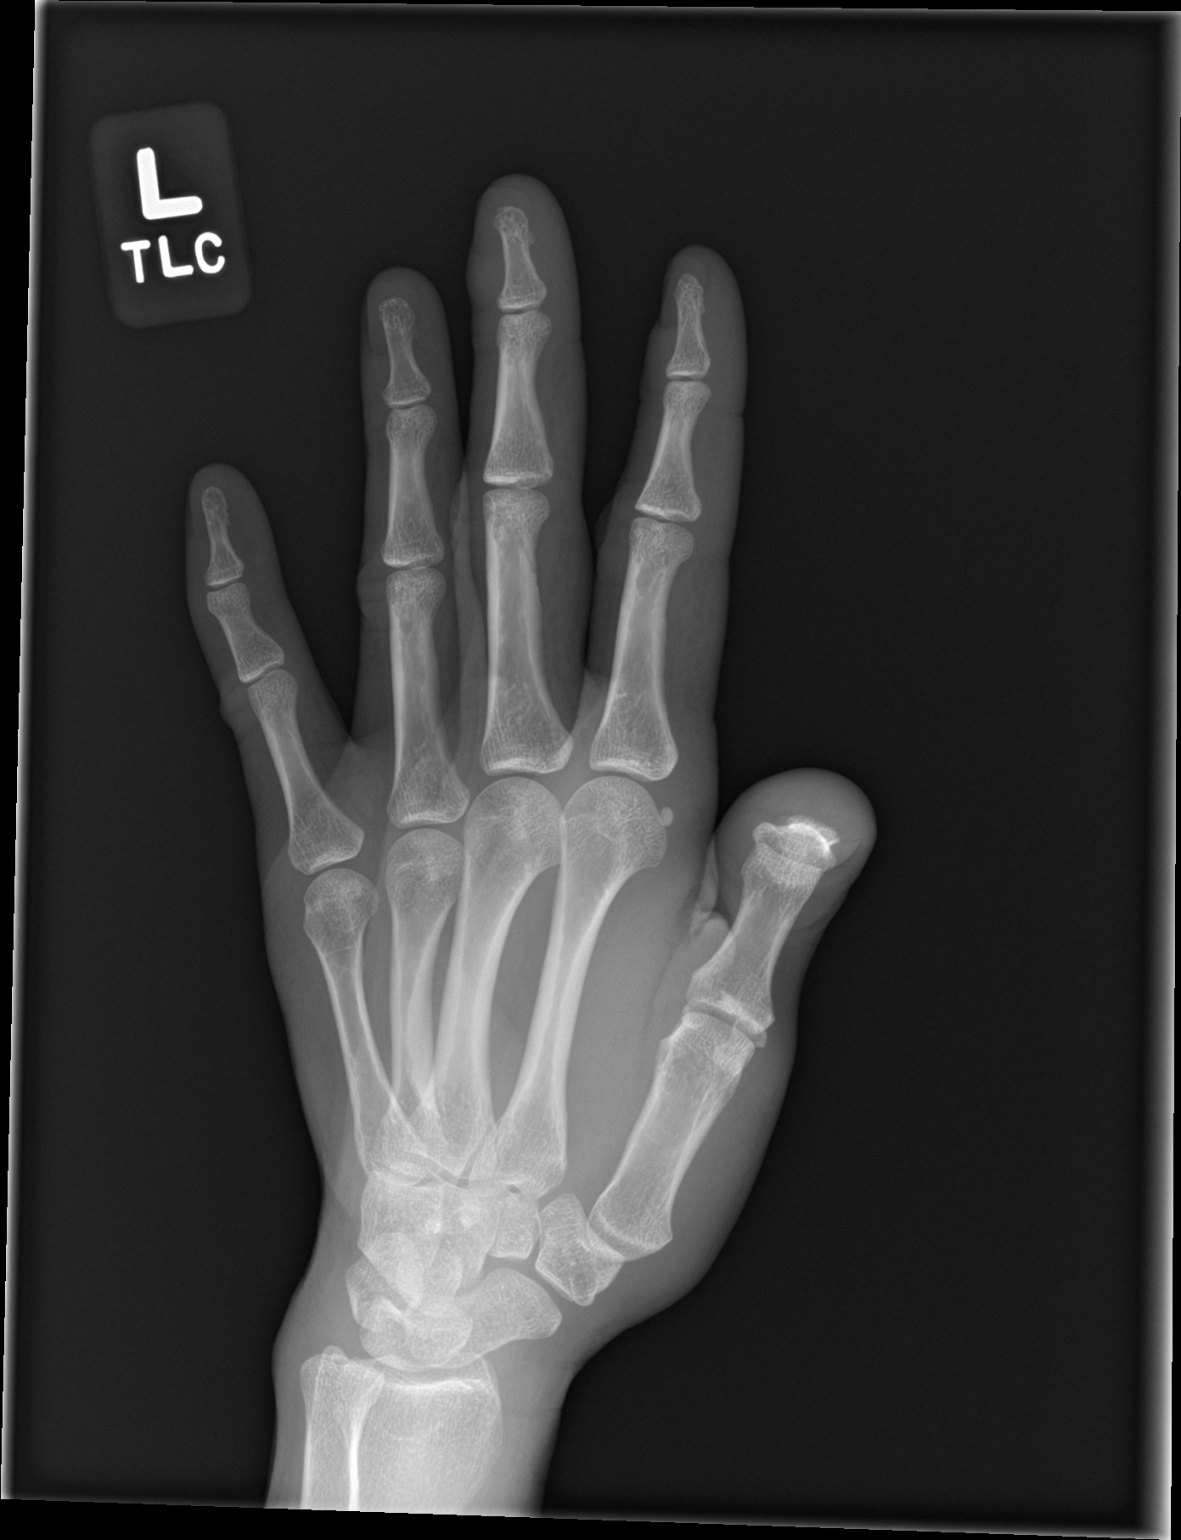

[hand lat]
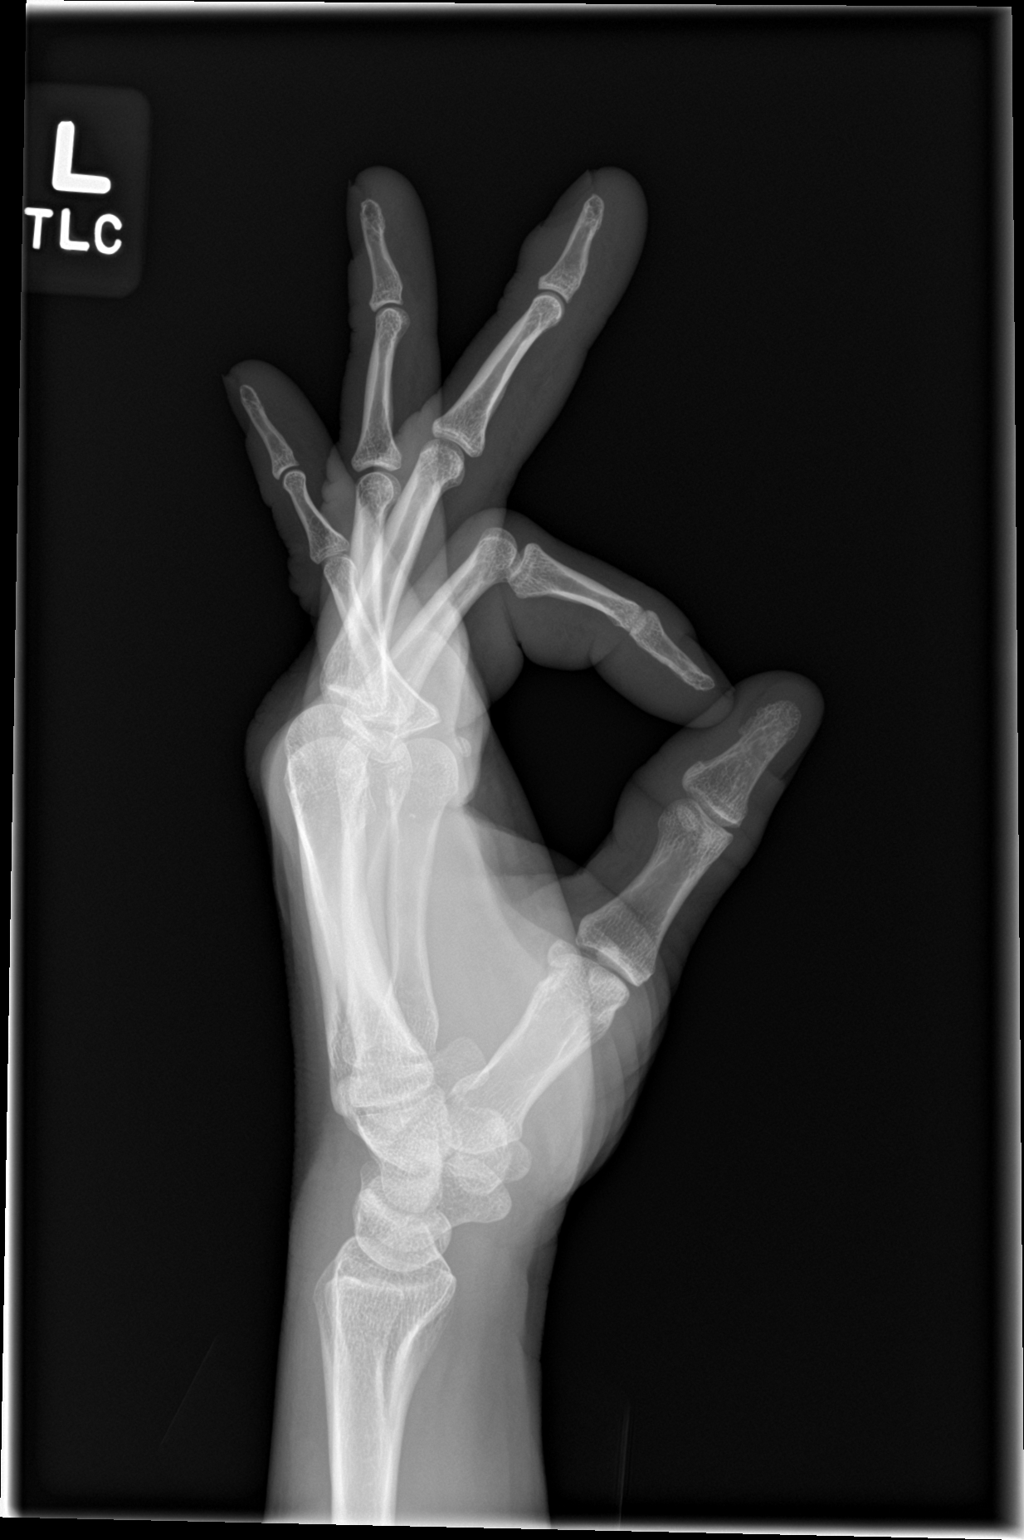

[3 of 3 positions shown; findings below may reference images not displayed]

FINDINGS: No evidence of fracture of the carpal or metacarpal bones.
Radiocarpal joint is intact. Phalanges are normal. No soft tissue
injury.
IMPRESSION: No fracture or dislocation.

## 2021-05-20 ENCOUNTER — Other Ambulatory Visit: Payer: Self-pay

## 2021-05-20 ENCOUNTER — Emergency Department (HOSPITAL_COMMUNITY)
Admission: EM | Admit: 2021-05-20 | Discharge: 2021-05-21 | Disposition: A | Discharge status: Other Acute Inpatient Hospital | Payer: Self-pay | Attending: Emergency Medicine | Admitting: Emergency Medicine

## 2021-05-20 ENCOUNTER — Encounter (HOSPITAL_COMMUNITY): Payer: Self-pay

## 2021-05-20 DIAGNOSIS — Z20822 Contact with and (suspected) exposure to covid-19: Secondary | ICD-10-CM | POA: Insufficient documentation

## 2021-05-20 DIAGNOSIS — F17210 Nicotine dependence, cigarettes, uncomplicated: Secondary | ICD-10-CM | POA: Insufficient documentation

## 2021-05-20 DIAGNOSIS — T1491XA Suicide attempt, initial encounter: Secondary | ICD-10-CM

## 2021-05-20 DIAGNOSIS — R45851 Suicidal ideations: Secondary | ICD-10-CM | POA: Insufficient documentation

## 2021-05-20 DIAGNOSIS — F1124 Opioid dependence with opioid-induced mood disorder: Secondary | ICD-10-CM | POA: Insufficient documentation

## 2021-05-20 DIAGNOSIS — F332 Major depressive disorder, recurrent severe without psychotic features: Secondary | ICD-10-CM | POA: Insufficient documentation

## 2021-05-20 DIAGNOSIS — N9489 Other specified conditions associated with female genital organs and menstrual cycle: Secondary | ICD-10-CM | POA: Insufficient documentation

## 2021-05-20 LAB — CBC WITH DIFFERENTIAL/PLATELET
Abs Immature Granulocytes: 0.02 10*3/uL (ref 0.00–0.07)
Basophils Absolute: 0.1 10*3/uL (ref 0.0–0.1)
Basophils Relative: 1 %
Eosinophils Absolute: 0.7 10*3/uL — ABNORMAL HIGH (ref 0.0–0.5)
Eosinophils Relative: 8 %
HCT: 40.2 % (ref 36.0–46.0)
Hemoglobin: 13 g/dL (ref 12.0–15.0)
Immature Granulocytes: 0 %
Lymphocytes Relative: 46 %
Lymphs Abs: 3.9 10*3/uL (ref 0.7–4.0)
MCH: 28.9 pg (ref 26.0–34.0)
MCHC: 32.3 g/dL (ref 30.0–36.0)
MCV: 89.3 fL (ref 80.0–100.0)
Monocytes Absolute: 1.1 10*3/uL — ABNORMAL HIGH (ref 0.1–1.0)
Monocytes Relative: 14 %
Neutro Abs: 2.5 10*3/uL (ref 1.7–7.7)
Neutrophils Relative %: 31 %
Platelets: 462 10*3/uL — ABNORMAL HIGH (ref 150–400)
RBC: 4.5 MIL/uL (ref 3.87–5.11)
RDW: 17.5 % — ABNORMAL HIGH (ref 11.5–15.5)
WBC: 8.3 10*3/uL (ref 4.0–10.5)
nRBC: 0 % (ref 0.0–0.2)

## 2021-05-20 LAB — I-STAT BETA HCG BLOOD, ED (MC, WL, AP ONLY): I-stat hCG, quantitative: <5 m[IU]/mL (ref <5)

## 2021-05-20 LAB — ETHANOL: Alcohol, Ethyl (B): <10 mg/dL (ref <10)
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
## 2021-05-20 NOTE — ED Provider Notes (Signed)
Emergency Medicine Provider Triage Evaluation Note  Sonia Warner , a 45 y.o. female  was evaluated in triage.  Pt complains of suicide attempt.  Patient states that she attempted to hang herself with the strap of her purse.  She states that she did not use her body weight but thinks that she passed out.  The strap apparently loosened.  She has also been taking fentanyl.  She has long history of this and has been injecting into her arms.  No difficulty breathing or significant neck pain.  No fevers or history of endocarditis per her report.  No swelling of the skin or any abscesses.  Review of Systems  Positive: Suicidal ideation Negative: Fevers  Physical Exam  BP (!) 147/95   Pulse 95   Temp 98.4 F (36.9 C) (Oral)   Resp 18   SpO2 99%  Gen:   Awake, no distress   Resp:  Normal effort  MSK:   Moves extremities without difficulty  Other:  Track marks noted without abscess, patient is tearful  Medical Decision Making  Medically screening exam initiated at 11:00 PM.  Appropriate orders placed.  Marguerette Sheller was informed that the remainder of the evaluation will be completed by another provider, this initial triage assessment does not replace that evaluation, and the importance of remaining in the ED until their evaluation is complete.  Plan: Medical screening, TTS evaluation.   Renne Crigler, PA-C 05/20/21 2302    Gloris Manchester, MD 05/22/21 1200

## 2021-05-21 ENCOUNTER — Inpatient Hospital Stay: Admission: AD | Admit: 2021-05-21 | Payer: Self-pay | Source: Home / Self Care | Admitting: Emergency Medicine

## 2021-05-21 ENCOUNTER — Other Ambulatory Visit (HOSPITAL_COMMUNITY)
Admission: EM | Admit: 2021-05-21 | Discharge: 2021-05-23 | Disposition: A | Discharge status: Home / Self Care | Payer: No Payment, Other | Attending: Family | Admitting: Family

## 2021-05-21 ENCOUNTER — Encounter (HOSPITAL_COMMUNITY): Payer: Self-pay | Admitting: Emergency Medicine

## 2021-05-21 DIAGNOSIS — X838XXA Intentional self-harm by other specified means, initial encounter: Secondary | ICD-10-CM | POA: Insufficient documentation

## 2021-05-21 DIAGNOSIS — F1910 Other psychoactive substance abuse, uncomplicated: Secondary | ICD-10-CM

## 2021-05-21 DIAGNOSIS — F419 Anxiety disorder, unspecified: Secondary | ICD-10-CM | POA: Insufficient documentation

## 2021-05-21 DIAGNOSIS — F17210 Nicotine dependence, cigarettes, uncomplicated: Secondary | ICD-10-CM | POA: Insufficient documentation

## 2021-05-21 DIAGNOSIS — T1491XA Suicide attempt, initial encounter: Secondary | ICD-10-CM | POA: Insufficient documentation

## 2021-05-21 DIAGNOSIS — F319 Bipolar disorder, unspecified: Secondary | ICD-10-CM | POA: Insufficient documentation

## 2021-05-21 DIAGNOSIS — F1090 Alcohol use, unspecified, uncomplicated: Secondary | ICD-10-CM | POA: Insufficient documentation

## 2021-05-21 DIAGNOSIS — F1914 Other psychoactive substance abuse with psychoactive substance-induced mood disorder: Secondary | ICD-10-CM | POA: Insufficient documentation

## 2021-05-21 DIAGNOSIS — F1994 Other psychoactive substance use, unspecified with psychoactive substance-induced mood disorder: Secondary | ICD-10-CM | POA: Diagnosis present

## 2021-05-21 LAB — RESP PANEL BY RT-PCR (FLU A&B, COVID) ARPGX2
Influenza A by PCR: NEGATIVE
Influenza B by PCR: NEGATIVE
SARS Coronavirus 2 by RT PCR: NEGATIVE

## 2021-05-21 LAB — COMPREHENSIVE METABOLIC PANEL
ALT: 157 U/L — ABNORMAL HIGH (ref 0–44)
AST: 188 U/L — ABNORMAL HIGH (ref 15–41)
Albumin: 3.9 g/dL (ref 3.5–5.0)
Alkaline Phosphatase: 76 U/L (ref 38–126)
Anion gap: 4 — ABNORMAL LOW (ref 5–15)
BUN: 18 mg/dL (ref 6–20)
CO2: 26 mmol/L (ref 22–32)
Calcium: 9.9 mg/dL (ref 8.9–10.3)
Chloride: 103 mmol/L (ref 98–111)
Creatinine, Ser: 0.6 mg/dL (ref 0.44–1.00)
GFR, Estimated: >60 mL/min (ref >60)
Glucose, Bld: 121 mg/dL — ABNORMAL HIGH (ref 70–99)
Potassium: 4.6 mmol/L (ref 3.5–5.1)
Sodium: 133 mmol/L — ABNORMAL LOW (ref 135–145)
Total Bilirubin: 0.6 mg/dL (ref 0.3–1.2)
Total Protein: 8.2 g/dL — ABNORMAL HIGH (ref 6.5–8.1)

## 2021-05-21 MED ORDER — LORAZEPAM 1 MG PO TABS
0.0000 mg | ORAL_TABLET | Freq: Four times a day (QID) | ORAL | Status: DC
  Filled 2021-05-21: qty 1

## 2021-05-21 MED ORDER — ONDANSETRON HCL 4 MG PO TABS
4.0000 mg | ORAL_TABLET | Freq: Three times a day (TID) | ORAL | Status: DC | PRN
Start: 2021-05-21 — End: 2021-05-21

## 2021-05-21 MED ORDER — THIAMINE HCL 100 MG/ML IJ SOLN: 100.0000 mg | Freq: Every day | INTRAMUSCULAR | Status: DC

## 2021-05-21 MED ORDER — CLONIDINE HCL 0.1 MG PO TABS
0.1000 mg | ORAL_TABLET | ORAL | Status: DC
  Administered 2021-05-23: 0.1 mg via ORAL
  Filled 2021-05-21: qty 1

## 2021-05-21 MED ORDER — LORAZEPAM 2 MG/ML IJ SOLN
0.0000 mg | Freq: Four times a day (QID) | INTRAMUSCULAR | Status: DC
  Administered 2021-05-21: 0 mg via INTRAVENOUS

## 2021-05-21 MED ORDER — LORAZEPAM 2 MG/ML IJ SOLN: 0.0000 mg | Freq: Two times a day (BID) | INTRAMUSCULAR | Status: DC

## 2021-05-21 MED ORDER — GABAPENTIN 100 MG PO CAPS
100.0000 mg | ORAL_CAPSULE | Freq: Two times a day (BID) | ORAL | Status: DC
  Administered 2021-05-21 – 2021-05-23 (×5): 100 mg via ORAL
  Filled 2021-05-21 (×5): qty 1

## 2021-05-21 MED ORDER — CLONIDINE HCL 0.1 MG PO TABS
0.1000 mg | ORAL_TABLET | Freq: Four times a day (QID) | ORAL | Status: AC
  Administered 2021-05-21 – 2021-05-22 (×7): 0.1 mg via ORAL
  Filled 2021-05-21 (×7): qty 1

## 2021-05-21 MED ORDER — NAPROXEN 500 MG PO TABS
500.0000 mg | ORAL_TABLET | Freq: Two times a day (BID) | ORAL | Status: DC | PRN
  Administered 2021-05-21 – 2021-05-22 (×3): 500 mg via ORAL
  Filled 2021-05-21 (×3): qty 1

## 2021-05-21 MED ORDER — HYDROXYZINE HCL 25 MG PO TABS
25.0000 mg | ORAL_TABLET | Freq: Four times a day (QID) | ORAL | Status: DC | PRN
  Administered 2021-05-21 – 2021-05-23 (×4): 25 mg via ORAL
  Filled 2021-05-21 (×4): qty 1

## 2021-05-21 MED ORDER — CLONIDINE HCL 0.1 MG PO TABS: 0.1000 mg | ORAL_TABLET | Freq: Every day | ORAL | Status: DC

## 2021-05-21 MED ORDER — OXCARBAZEPINE 150 MG PO TABS
75.0000 mg | ORAL_TABLET | Freq: Two times a day (BID) | ORAL | Status: DC
  Administered 2021-05-21 – 2021-05-23 (×5): 75 mg via ORAL
  Filled 2021-05-21 (×5): qty 1

## 2021-05-21 MED ORDER — DICYCLOMINE HCL 20 MG PO TABS
20.0000 mg | ORAL_TABLET | Freq: Four times a day (QID) | ORAL | Status: DC | PRN
  Administered 2021-05-22 (×2): 20 mg via ORAL
  Filled 2021-05-21 (×2): qty 1

## 2021-05-21 MED ORDER — ONDANSETRON 4 MG PO TBDP: 4.0000 mg | ORAL_TABLET | Freq: Four times a day (QID) | ORAL | Status: DC | PRN

## 2021-05-21 MED ORDER — LOPERAMIDE HCL 2 MG PO CAPS: 2.0000 mg | ORAL_CAPSULE | ORAL | Status: DC | PRN

## 2021-05-21 MED ORDER — THIAMINE HCL 100 MG PO TABS
100.0000 mg | ORAL_TABLET | Freq: Every day | ORAL | Status: DC
  Administered 2021-05-21: 100 mg via ORAL
  Filled 2021-05-21: qty 1

## 2021-05-21 MED ORDER — METHOCARBAMOL 500 MG PO TABS
500.0000 mg | ORAL_TABLET | Freq: Three times a day (TID) | ORAL | Status: DC | PRN
  Administered 2021-05-21 – 2021-05-23 (×5): 500 mg via ORAL
  Filled 2021-05-21 (×5): qty 1

## 2021-05-21 MED ORDER — LORAZEPAM 1 MG PO TABS
0.0000 mg | ORAL_TABLET | Freq: Two times a day (BID) | ORAL | Status: DC
Start: 2021-05-23 — End: 2021-05-21
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
## 2021-05-21 NOTE — Consult Note (Signed)
[id number] [facility] Face-to-Face Psychiatry Consult   Reason for Consult: Suicidal ideations Referring Physician: Dr. Lucianne Muss Patient Identification: Eliora Nienhuis MRN:  [id number] Principal Diagnosis: <principal problem not specified> Diagnosis:  Active Problems:   * No active hospital problems. *   Total Time spent with patient: 30 minutes  Subjective:   Shamra Bradeen is a 45 y.o. female patient admitted with depression, hopelessness, substance use.  Patient was admitted after she attempted to hang herself twice yesterday.  At that time patient does admit to being under the influence of fentanyl.  She reports having used fentanyl daily for the past year.  She reports a history of suicidal thoughts, no previous attempts until yesterday.  She is unable to identify any protective factors at this time.  She denies having any support system, religious preferences.  She also reports she is not involved in any volatile relationship, and her significant other is not supportive.  She does endorse and express much interest into seeking help, and shows insight into her underlying mental condition.  She also is very tearful during assessment and her physical presentation is consistent with depression and anxiety.  She does not appear to be responding to internal stimuli, external stimuli, or evidence of any delusional thought disorder.  She continues to endorse passive suicidal ideations, and is able to contract for safety.  HPI:  Pt had attempted to hang herself twice yesterday with the strap to a purse.  Pt also says she used fentanyl yesterday.  Pt says that she and boyfriend not getting along has been a contributing factor to her attempted suicide.  Pt says that "it got violent yesterday."  Relationship has been poor quality since the begining a year and a half ago.  Pt has had SI for the last 6 months.  Her first attempt was yesterday.  Pt is currently denying SI "I just feel sad."  Pt is tearful during assessment.  Pt  denies any HI or A/V halluciantions.  No recent outpatient care.  Past Psychiatric History: Depression, anxiety, ADD, opiate use disorder severe currently is receiving no outpatient psychiatric resources.  Currently is not taking any psychotropic medications to manage the above.  Denies any recent inpatient hospitalizations.  Denies any additional suicide attempts with the exception of yesterday.  Risk to Self: Yes Risk to Others: No Prior Inpatient Therapy: Denies Prior Outpatient Therapy: Substance abuse programming  Past Medical History:  Past Medical History:  Diagnosis Date   Collapse of left lung    Psoriasis     Past Surgical History:  Procedure Laterality Date   SPLENECTOMY     Family History:  Family History  Problem Relation Age of Onset   Hypertension Mother    Stroke Father    Family Psychiatric  History: Denies Social History:  Social History   Substance and Sexual Activity  Alcohol Use Yes   Comment: 1-2  month / Occasional     Social History   Substance and Sexual Activity  Drug Use Yes   Types: Heroin, Methamphetamines   Comment: Heroin and methamphetamine     Social History   Socioeconomic History   Marital status: Divorced    Spouse name: Not on file   Number of children: Not on file   Years of education: Not on file   Highest education level: Not on file  Occupational History   Not on file  Tobacco Use   Smoking status: Every Day    Packs/day: 0.25    Years: 30.00  Pack years: 7.50    Types: Cigarettes   Smokeless tobacco: Never  Vaping Use   Vaping Use: Some days  Substance and Sexual Activity   Alcohol use: Yes    Comment: 1-2  month / Occasional   Drug use: Yes    Types: Heroin, Methamphetamines    Comment: Heroin and methamphetamine    Sexual activity: Not on file  Other Topics Concern   Not on file  Social History Narrative   Not on file   Social Determinants of Health   Financial Resource Strain: Not on file  Food  Insecurity: Not on file  Transportation Needs: Not on file  Physical Activity: Not on file  Stress: Not on file  Social Connections: Not on file   Additional Social History:    Allergies:  No Known Allergies  Labs:  Results for orders placed or performed during the hospital encounter of 05/20/21 (from the past 48 hour(s))  Comprehensive metabolic panel     Status: Abnormal   Collection Time: 05/20/21 11:10 PM  Result Value Ref Range   Sodium 133 (L) 135 - 145 mmol/L   Potassium 4.6 3.5 - 5.1 mmol/L   Chloride 103 98 - 111 mmol/L   CO2 26 22 - 32 mmol/L   Glucose, Bld 121 (H) 70 - 99 mg/dL    Comment: Glucose reference range applies only to samples taken after fasting for at least 8 hours.   BUN 18 6 - 20 mg/dL   Creatinine, Ser 1.09 0.44 - 1.00 mg/dL   Calcium 9.9 8.9 - 32.3 mg/dL   Total Protein 8.2 (H) 6.5 - 8.1 g/dL   Albumin 3.9 3.5 - 5.0 g/dL   AST 557 (H) 15 - 41 U/L   ALT 157 (H) 0 - 44 U/L   Alkaline Phosphatase 76 38 - 126 U/L   Total Bilirubin 0.6 0.3 - 1.2 mg/dL   GFR, Estimated >32 >20 mL/min    Comment: (NOTE) Calculated using the CKD-EPI Creatinine Equation (2021)    Anion gap 4 (L) 5 - 15    Comment: Performed at [facility], [address], [place], [place] [address]  Ethanol     Status: None   Collection Time: 05/20/21 11:10 PM  Result Value Ref Range   Alcohol, Ethyl (B) <10 <10 mg/dL    Comment: (NOTE) Lowest detectable limit for serum alcohol is 10 mg/dL.  For medical purposes only. Performed at [facility], [address], [place], [place] [address]   CBC with Diff     Status: Abnormal   Collection Time: 05/20/21 11:10 PM  Result Value Ref Range   WBC 8.3 4.0 - 10.5 K/uL   RBC 4.50 3.87 - 5.11 MIL/uL   Hemoglobin 13.0 12.0 - 15.0 g/dL   HCT 62.8 31.5 - 17.6 %   MCV 89.3 80.0 - 100.0 fL   MCH 28.9 26.0 - 34.0 pg   MCHC 32.3 30.0 - 36.0 g/dL   RDW 16.0 (H) 73.7 - 10.6 %   Platelets 462 (H) 150 - 400  K/uL   nRBC 0.0 0.0 - 0.2 %   Neutrophils Relative % 31 %   Neutro Abs 2.5 1.7 - 7.7 K/uL   Lymphocytes Relative 46 %   Lymphs Abs 3.9 0.7 - 4.0 K/uL   Monocytes Relative 14 %   Monocytes Absolute 1.1 (H) 0.1 - 1.0 K/uL   Eosinophils Relative 8 %   Eosinophils Absolute 0.7 (H) 0.0 - 0.5 K/uL  Basophils Relative 1 %   Basophils Absolute 0.1 0.0 - 0.1 K/uL   Immature Granulocytes 0 %   Abs Immature Granulocytes 0.02 0.00 - 0.07 K/uL    Comment: Performed at [facility], [address], [place], [place] [address]  Resp Panel by RT-PCR (Flu A&B, Covid) Nasopharyngeal Swab     Status: None   Collection Time: 05/20/21 11:10 PM   Specimen: Nasopharyngeal Swab; Nasopharyngeal(NP) swabs in vial transport medium  Result Value Ref Range   SARS Coronavirus 2 by RT PCR NEGATIVE NEGATIVE    Comment: (NOTE) SARS-CoV-2 target nucleic acids are NOT DETECTED.  The SARS-CoV-2 RNA is generally detectable in upper respiratory specimens during the acute phase of infection. The lowest concentration of SARS-CoV-2 viral copies this assay can detect is 138 copies/mL. A negative result does not preclude SARS-Cov-2 infection and should not be used as the sole basis for treatment or other patient management decisions. A negative result may occur with  improper specimen collection/handling, submission of specimen other than nasopharyngeal swab, presence of viral mutation(s) within the areas targeted by this assay, and inadequate number of viral copies(<138 copies/mL). A negative result must be combined with clinical observations, patient history, and epidemiological information. The expected result is Negative.  Fact Sheet for Patients:  BloggerCourse.com  Fact Sheet for Healthcare Providers:  SeriousBroker.it  This test is no t yet approved or cleared by the [place] FDA and  has been authorized for detection and/or diagnosis of  SARS-CoV-2 by FDA under an Emergency Use Authorization (EUA). This EUA will remain  in effect (meaning this test can be used) for the duration of the COVID-19 declaration under Section 564(b)(1) of the Act, 21 U.S.C.section 360bbb-3(b)(1), unless the authorization is terminated  or revoked sooner.       Influenza A by PCR NEGATIVE NEGATIVE   Influenza B by PCR NEGATIVE NEGATIVE    Comment: (NOTE) The Xpert Xpress SARS-CoV-2/FLU/RSV plus assay is intended as an aid in the diagnosis of influenza from Nasopharyngeal swab specimens and should not be used as a sole basis for treatment. Nasal washings and aspirates are unacceptable for Xpert Xpress SARS-CoV-2/FLU/RSV testing.  Fact Sheet for Patients: BloggerCourse.com  Fact Sheet for Healthcare Providers: SeriousBroker.it  This test is not yet approved or cleared by the [place] FDA and has been authorized for detection and/or diagnosis of SARS-CoV-2 by FDA under an Emergency Use Authorization (EUA). This EUA will remain in effect (meaning this test can be used) for the duration of the COVID-19 declaration under Section 564(b)(1) of the Act, 21 U.S.C. section 360bbb-3(b)(1), unless the authorization is terminated or revoked.  Performed at [facility], [address] [place], [place] [address]   I-Stat beta hCG blood, ED     Status: None   Collection Time: 05/20/21 11:17 PM  Result Value Ref Range   I-stat hCG, quantitative <5.0 <5 mIU/mL   Comment 3            Comment:   GEST. AGE      CONC.  (mIU/mL)   <=1 WEEK        5 - 50     2 WEEKS       50 - 500     3 WEEKS       100 - 10,000     4 WEEKS     1,000 - 30,000        FEMALE AND NON-PREGNANT FEMALE:     LESS  THAN 5 mIU/mL     No current facility-administered medications for this encounter.   No current outpatient medications on file.    Musculoskeletal: Strength & Muscle Tone: within normal  limits Gait & Station: normal Patient leans: N/A    Psychiatric Specialty Exam:  Presentation  General Appearance:  Disheveled Eye Contact: None Speech: Clear and Coherent; Slow Speech Volume: Decreased Handedness: Right  Mood and Affect  Mood: Hopeless; Depressed; Worthless Affect: Tearful; Depressed  Thought Process  Thought Processes: Coherent; Linear Descriptions of Associations:Intact Orientation:Full (Time, Place and Person) Thought Content:Logical History of Schizophrenia/Schizoaffective disorder:No  Duration of Psychotic Symptoms:No data recorded Hallucinations:Hallucinations: None Ideas of Reference:None Suicidal Thoughts:Suicidal Thoughts: Yes, Passive SI Passive Intent and/or Plan: With Intent; With Plan; With Means to Carry Out; Without Access to Means Homicidal Thoughts:Homicidal Thoughts: No  Sensorium  Memory: Immediate Fair; Remote Fair; Recent Fair Judgment: Fair Insight: Fair  Art therapist  Concentration: Fair Attention Span: Fair Recall: [employer] of Knowledge: Fair Language: Fair  Psychomotor Activity  Psychomotor Activity: Psychomotor Activity: Normal  Assets  Assets: Desire for Improvement; Communication Skills; Leisure Time; Physical Health; Resilience; Financial Resources/Insurance; Housing  Sleep  Sleep: Sleep: Fair  Physical Exam: Physical Exam ROS There were no vitals taken for this visit. There is no height or weight on file to calculate BMI.  Treatment Plan Summary: Daily contact with patient to assess and evaluate symptoms and progress in treatment, Medication management, and Plan will start COWS detox protocol, will start clonidine protocol.   -Will start gabapentin 100 mg p.o. twice daily -We will start Trileptal 75 mg p.o. twice daily -Continue with 15-minute checks.  -Please expect mood irritability, emotional dysregulation, and increase in agitation and aggression as patient begins to  withdraw from her substance of choice fentanyl. -Recommend appropriate disposition to include residential short time substance abuse housing program.  Disposition: Patient does not meet criteria for psychiatric inpatient admission. Supportive therapy provided about ongoing stressors. Refer to IOP. Discussed crisis plan, support from social network, calling 911, coming to the Emergency Department, and calling Suicide Hotline.  Maryagnes Amos, FNP 05/21/2021 12:36 PM

## 2021-05-21 NOTE — ED Notes (Signed)
Patient was admitted to [facility] unit. Patient was transferred from [address] [facility] transported and escorted by [employer]. Patient was oriented to her room and instructed to come to the desk and let staff know if she needs anything. Patient calm and cooperative throughout the interview process. Patient states "she wants to get high." Patient was praised for coming to get help. Patient denies SI/HI and AVH. Patient will be monitored for safety.
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
## 2021-05-21 NOTE — ED Notes (Signed)
Pt came to the nurses station stating she was having generalized pain and withdrawing from fentanyl this nurse advised the patient that she PRN naproxen on file, I advised the patient I would administer the naproxen.

## 2021-05-21 NOTE — ED Provider Notes (Signed)
FBC Admission Suicide Risk Assessment   Nursing information obtained from:   HPI and patient Demographic factors:   Lives with boyfriend Current Mental Status:   Depressed and withdrawing Loss Factors:    no support system, substance abuse, unemployed Historical Factors:    Risk Reduction Factors:    seeking treatment  Total Time spent with patient: 30 minutes Principal Problem: <principal problem not specified> Diagnosis:  Active Problems:   * No active hospital problems. *  Subjective Data: Sonia Warner is a 45 y.o. female who presents to the ED today after suicide attempt.  Patient states that she had a verbal altercation with her significant other today and she has had a tumultuous relationship with him.  She states that she decided to try to hang herself.  She states that she attempted to hang herself twice today with the strap of her purse.  She reports that the second time the strap broke however she thinks she may have passed out as her boyfriend found her laying on the ground.  She denies any pain at this time.  She denies any specific neck pain.  She does admit to taking fentanyl via IV earlier tonight.   Continued Clinical Symptoms:    The "Alcohol Use Disorders Identification Test", Guidelines for Use in Primary Care, Second Edition.  World Science writer [facility]). Score between 0-7:  no or low risk or alcohol related problems. Score between 8-15:  moderate risk of alcohol related problems. Score between 16-19:  high risk of alcohol related problems. Score 20 or above:  warrants further diagnostic evaluation for alcohol dependence and treatment.   CLINICAL FACTORS:   Severe Anxiety and/or Agitation Depression:   Comorbid alcohol abuse/dependence Hopelessness Insomnia Alcohol/Substance Abuse/Dependencies More than one psychiatric diagnosis Unstable or Poor Therapeutic Relationship Previous Psychiatric Diagnoses and Treatments   Musculoskeletal: Strength & Muscle  Tone: within normal limits Gait & Station: normal Patient leans: N/A  Psychiatric Specialty Exam:  Presentation  General Appearance: Disheveled  Eye Contact:None  Speech:Clear and Coherent; Slow  Speech Volume:Decreased  Handedness:Right   Mood and Affect  Mood:Hopeless; Depressed; Worthless  Affect:Tearful; Depressed   Thought Process  Thought Processes:Coherent; Linear  Descriptions of Associations:Intact  Orientation:Full (Time, Place and Person)  Thought Content:Logical  History of Schizophrenia/Schizoaffective disorder:No  Duration of Psychotic Symptoms:No data recorded Hallucinations:Hallucinations: None  Ideas of Reference:None  Suicidal Thoughts:Suicidal Thoughts: Yes, Passive SI Passive Intent and/or Plan: With Intent; With Plan; With Means to Carry Out; Without Access to Means  Homicidal Thoughts:Homicidal Thoughts: No   Sensorium  Memory:Immediate Fair; Remote Fair; Recent Fair  Judgment:Fair  Insight:Fair   Executive Functions  Concentration:Fair  Attention Span:Fair  Recall:Fair  Fund of Knowledge:Fair  Language:Fair   Psychomotor Activity  Psychomotor Activity:Psychomotor Activity: Normal   Assets  Assets:Desire for Improvement; Manufacturing systems engineer; Leisure Time; Physical Health; Resilience; Financial Resources/Insurance; Housing   Sleep  Sleep:Sleep: Fair    Physical Exam: Physical Exam ROS Blood pressure 140/75, pulse 77, temperature 97.8 F (36.6 C), temperature source Oral, resp. rate 18, SpO2 100 %. There is no height or weight on file to calculate BMI.   COGNITIVE FEATURES THAT CONTRIBUTE TO RISK:  None    SUICIDE RISK:   Minimal: No identifiable suicidal ideation.  Patients presenting with no risk factors but with morbid ruminations; may be classified as minimal risk based on the severity of the depressive symptoms  PLAN OF CARE: FBC admission, start medication, COWS/Clonidine detox protocol.  I  certify that inpatient services furnished  can reasonably be expected to improve the patient's condition.   Maryagnes Amos, FNP 05/21/2021, 2:53 PM

## 2021-05-21 NOTE — ED Notes (Addendum)
Pt is in bed, she states that she is having muscle spasm type pain in her legs and that she can not keep still. I advised patient she has some prn medications in her MAR to help her

## 2021-05-21 NOTE — Clinical Social Work Psych Note (Signed)
CSW Note   CSW met with patient for introduction and to being discussions regarding discharge planning.   Sonia Warner appeared to be in discomfort and remained laying in bed facing the opposite direction from this Probation officer. Sonia Warner shared that she felt "very sick", however was able to slightly engage throughout the encounter.   Sonia Warner shared that she does not have any supports in the area, with the exception of her current boyfriend. Sonia Warner reports she does not know what she would like in regards to discharge planning. CSW inquired about possible residential treatment placement, however Sonia Warner continued to express uncertainty.   Sonia Warner shared that she could not speak with this writer anymore, due to not feeling well. CSW and Sonia Warner agreed that they would reconvene tomorrow to further discuss discharge planning.    CSW will continue to follow.      Sonia Warner, MSW, LCSW Clinical Education officer, museum ([place]) [facility]

## 2021-05-21 NOTE — ED Notes (Signed)
COWS was done at 1320

## 2021-05-21 NOTE — Clinical Social Work Psych Note (Signed)
Emotional Reg & Skills  Emotional Regulation & Emotional Regulation Skills  Date: 05/21/21  Type of Therapy/Therapeutic Modalities: Processing Group, Motivational Interviewing, Socratic Questioning, Psycho-Education, CBT  Participation Level: Did Not Attend  Objective: The purpose of this group is to discuss with patients the importance of emotional regulations skills in every day life and how those skills assist them in maintaining or achieving overall well-being in all dimensions of life.  Therapeutic Goals:  Patient will identify emotions they struggle managing and will reflect on the triggering factors and the behavioral responses associated with them.  Patient will review emotional regulation techniques that will enhance their ability to manage their emotions more appropriately.  Patient will discuss with group members their findings and how they plan to implement emotional regulation techniques in their road to recovery.   Summary of Patient's Progress:  Patient did not attend group session. Patient did not attend group session. Patient remained asleep in her room. Patient reports she does not feel well enough to participate.

## 2021-05-21 NOTE — BH Assessment (Signed)
BHH Assessment Progress Note   Per Nira Conn, NP, this pt would benefit from admission to Facility Based Crisis at this time.  Doran Heater, NP has accepted pt.  EDP Meridee Score, MD and pt's nurse, Waynetta Sandy, have been notified.  Beth agrees to have pt sign Voluntary Admission and Consent for Treatment, to fax it to [phone], to send original paperwork along with pt via [employer], and to call report to [phone].  Doylene Canning, [place] Behavioral Health Coordinator [phone]

## 2021-05-21 NOTE — Progress Notes (Signed)
Pt is asleep. Respirations are even and unlabored. No signs of acute distress noted. Monitoring for pt's safety.

## 2021-05-21 NOTE — ED Notes (Signed)
Pt resting comfortably

## 2021-05-21 NOTE — Progress Notes (Addendum)
Pt's COWS was 10

## 2021-05-21 NOTE — ED Notes (Signed)
Pt Dcd off unit to [facility] per provider. Pt alert, calm, cooperative, no s/s of distress.  DC information and belongings given to [employer] for transport. Pt ambulatory off unit, escorted by NT. Pt transported by [employer].

## 2021-05-21 NOTE — Progress Notes (Signed)
Pt's COWS was 14. Fredna Dow, FNP notified via secure chat.

## 2021-05-22 DIAGNOSIS — F319 Bipolar disorder, unspecified: Secondary | ICD-10-CM | POA: Diagnosis not present

## 2021-05-22 DIAGNOSIS — F419 Anxiety disorder, unspecified: Secondary | ICD-10-CM | POA: Diagnosis not present

## 2021-05-22 DIAGNOSIS — F1914 Other psychoactive substance abuse with psychoactive substance-induced mood disorder: Secondary | ICD-10-CM | POA: Diagnosis not present

## 2021-05-22 DIAGNOSIS — T1491XA Suicide attempt, initial encounter: Secondary | ICD-10-CM | POA: Diagnosis not present

## 2021-05-22 MED ORDER — GABAPENTIN 300 MG PO CAPS
300.0000 mg | ORAL_CAPSULE | Freq: Once | ORAL | Status: AC
  Administered 2021-05-22: 300 mg via ORAL
  Filled 2021-05-22: qty 1

## 2021-05-22 MED ORDER — CLONIDINE HCL 0.1 MG PO TABS
0.1000 mg | ORAL_TABLET | Freq: Once | ORAL | Status: AC
  Administered 2021-05-22: 0.1 mg via ORAL
  Filled 2021-05-22: qty 1

## 2021-05-22 MED ORDER — HYDROXYZINE HCL 25 MG PO TABS
25.0000 mg | ORAL_TABLET | Freq: Once | ORAL | Status: AC
  Administered 2021-05-22: 25 mg via ORAL
  Filled 2021-05-22: qty 1

## 2021-05-22 MED ORDER — NAPROXEN 500 MG PO TABS
500.0000 mg | ORAL_TABLET | Freq: Once | ORAL | Status: AC
  Administered 2021-05-22: 500 mg via ORAL
  Filled 2021-05-22: qty 1

## 2021-05-22 NOTE — Progress Notes (Signed)
Patient admits to feeling slightly improved however remains dysphoric and uncomfortable.  She is presently resting in bed again after receiving another standing dose of clonidine.  Will continue to monitor and provide ongoing support.

## 2021-05-22 NOTE — Progress Notes (Signed)
Patient received 20mg  bentyl, vistaril and Naproxen for cramping, anxiety and generalized body ache.  She is eating a small snack now and has been encouraged to drink fluids.  She is slowly improving.  Will continue to monitor and provide support as needed.

## 2021-05-22 NOTE — ED Notes (Signed)
Pt given breakfast.

## 2021-05-22 NOTE — Progress Notes (Signed)
Cows = 11.  Patient is resting in bed with mild improvement since this morning.  Continuing to monitor, push PO fluids and provide encouragement to her during this aspect of WDL phase.

## 2021-05-22 NOTE — Progress Notes (Signed)
Received patient sleeping in bed comfortably.  NARD.  Will continue to monitor and meet needs as warranted.

## 2021-05-22 NOTE — ED Notes (Signed)
Pt eating dinner

## 2021-05-22 NOTE — ED Notes (Signed)
Pt has taken medications, however she is still complaining of body pain, she states it feels like her muscles are stretching and it is very uncomfortable.

## 2021-05-22 NOTE — Clinical Social Work Psych Note (Signed)
CSW Note  CSW met with patient to follow up and to assess her presentation.   Sonia Warner presented irritable, while lying in bed she rocked back and forth and appeared to be "squirmish". Sonia Warner shared that she was experiencing withdrawals from Fentanyl and felt "terrible".   Sonia Warner reported that she did not want to go to a residential rehabilitation program at discharge, however she would be open to any outpatient psychiatric and substance use services.   Sonia Warner shared that she does not have many supports in the community. She stated that she plans to return home with her boyfriend at discharge. Sonia Warner also requested a referral to a Suboxone clinic.    CSW will continue to follow    Radonna Ricker, MSW, LCSW Clinical Social Worker ([place]) [facility]

## 2021-05-22 NOTE — Progress Notes (Signed)
Patient assisted with a hot shower and reports that "It helped a little."  She remains in opiate withdrawal but states that the bentyl and robaxin were helpful in relieving her muscle aches and cramps.  She has been sleeping or resting most of the day however gets up periodically for food, fluid and to use the bathroom.  Her wDL symptoms have improved since this morning.  Will continue to monitor and provide safe supportive environment

## 2021-05-22 NOTE — ED Notes (Signed)
Pt did not attend group; Pt was not feeling well due to detox.

## 2021-05-22 NOTE — Progress Notes (Signed)
Patient remains in opiate WDL and was given 20mg  bentyl and 500mg  robaxin for muscle pain and restlessness.  Waiting for effect.

## 2021-05-22 NOTE — ED Provider Notes (Signed)
Behavioral Health Progress Note  Date and Time: 05/22/2021 12:39 PM Name: Sonia Warner MRN:  [id number]  Subjective:   Patient is a 45 year old female who presents to [facility]  with worsening depression and suicidal attempt after trying to hang herself.  Patient has a history of diagnosis of bipolar disorder manic without psychosis, PTSD, depression.  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    Nurse practitioner case discussed with treatment team.  On evaluation patient presents alert and oriented, calm and cooperative, yet restless on evaluation. SHe is disheveled and wearing paper scrubs, observed to be very squirming in bed.  SHe has mild psychomotor agitation as observed by restlessness in her lower extremities and fidgeting with her hands.  SHe denies having a serious problem and denies any cravings or urges or withdrawal symptoms at this time with last use  2 days ago.    Patient was encouraged to help set up and develop a goal for her admission and crisis stabilization treatment plan.  SHe denies any sleeping or eating disturbances at this time.  SHe does endorse some anxiety however it is controlled at this time.  SHe was started on Clonidine detox protocol, as trileptal 75mg  po BID, gabapentin 100 mg p.o. BID for mood disorder, and hydroxyzine 25 mg for anxiety.  Her most recent COWS score was 14 and blood pressure is stable at this time.  SHe currently denies any suicidal thoughts, homicidal thoughts, hallucinations, and or psychosis.  She is able to contract for safety while on the unit.   Diagnosis:  Final diagnoses:  None    Total Time spent with patient: 30 minutes  Past Psychiatric History: See above Past Medical History:  Past Medical History:  Diagnosis Date   Collapse of left lung    Psoriasis     Past Surgical History:  Procedure Laterality Date   SPLENECTOMY     Family History:  Family History  Problem  Relation Age of Onset   Hypertension Mother    Stroke Father    Family Psychiatric  History: Denies Social History:  Social History   Substance and Sexual Activity  Alcohol Use Yes   Comment: 1-2  month / Occasional     Social History   Substance and Sexual Activity  Drug Use Yes   Types: Heroin, Methamphetamines   Comment: Heroin and methamphetamine     Social History   Socioeconomic History   Marital status: Divorced    Spouse name: Not on file   Number of children: Not on file   Years of education: Not on file   Highest education level: Not on file  Occupational History   Not on file  Tobacco Use   Smoking status: Unknown   Smokeless tobacco: Not on file  Vaping Use   Vaping Use: Some days  Substance and Sexual Activity   Alcohol use: Yes    Comment: 1-2  month / Occasional   Drug use: Yes    Types: Heroin, Methamphetamines    Comment: Heroin and methamphetamine    Sexual activity: Not on file  Other Topics Concern   Not on file  Social History Narrative   Not on file   Social Determinants of Health   Financial Resource Strain: Not on file  Food Insecurity: Not on file  Transportation Needs: Not on file  Physical Activity: Not on file  Stress: Not on file  Social Connections: Not on file  SDOH:  SDOH Screenings   Alcohol Screen: Not on file  Depression (PHQ2-9): Not on file  Financial Resource Strain: Not on file  Food Insecurity: Not on file  Housing: Not on file  Physical Activity: Not on file  Social Connections: Not on file  Stress: Not on file  Tobacco Use: Not on file  Transportation Needs: Not on file   Additional Social History:        Sleep: Fair  Appetite:  Poor  Current Medications:  Current Facility-Administered Medications  Medication Dose Route Frequency Provider Last Rate Last Admin   cloNIDine (CATAPRES) tablet 0.1 mg  0.1 mg Oral QID Maryagnes Amos, FNP   0.1 mg at 05/22/21 4650   Followed by   Melene Muller ON  05/23/2021] cloNIDine (CATAPRES) tablet 0.1 mg  0.1 mg Oral BH-qamhs Starkes-Perry, Juel Burrow, FNP       Followed by   Melene Muller ON 05/25/2021] cloNIDine (CATAPRES) tablet 0.1 mg  0.1 mg Oral QAC breakfast Starkes-Perry, Juel Burrow, FNP       dicyclomine (BENTYL) tablet 20 mg  20 mg Oral Q6H PRN Maryagnes Amos, FNP   20 mg at 05/22/21 0918   gabapentin (NEURONTIN) capsule 100 mg  100 mg Oral BID Maryagnes Amos, FNP   100 mg at 05/22/21 0917   hydrOXYzine (ATARAX/VISTARIL) tablet 25 mg  25 mg Oral Q6H PRN Maryagnes Amos, FNP   25 mg at 05/22/21 0022   loperamide (IMODIUM) capsule 2-4 mg  2-4 mg Oral PRN Maryagnes Amos, FNP       methocarbamol (ROBAXIN) tablet 500 mg  500 mg Oral Q8H PRN Maryagnes Amos, FNP   500 mg at 05/22/21 0918   naproxen (NAPROSYN) tablet 500 mg  500 mg Oral BID PRN Maryagnes Amos, FNP   500 mg at 05/21/21 2021   ondansetron (ZOFRAN-ODT) disintegrating tablet 4 mg  4 mg Oral Q6H PRN Maryagnes Amos, FNP       OXcarbazepine (TRILEPTAL) tablet 75 mg  75 mg Oral BID Maryagnes Amos, FNP   75 mg at 05/22/21 3546   No current outpatient medications on file.    Labs  Lab Results:  Admission on 05/20/2021, Discharged on 05/21/2021  Component Date Value Ref Range Status   Sodium 05/20/2021 133 (A) 135 - 145 mmol/L Final   Potassium 05/20/2021 4.6  3.5 - 5.1 mmol/L Final   Chloride 05/20/2021 103  98 - 111 mmol/L Final   CO2 05/20/2021 26  22 - 32 mmol/L Final   Glucose, Bld 05/20/2021 121 (A) 70 - 99 mg/dL Final   Glucose reference range applies only to samples taken after fasting for at least 8 hours.   BUN 05/20/2021 18  6 - 20 mg/dL Final   Creatinine, Ser 05/20/2021 0.60  0.44 - 1.00 mg/dL Final   Calcium 56/81/2751 9.9  8.9 - 10.3 mg/dL Final   Total Protein 70/08/7492 8.2 (A) 6.5 - 8.1 g/dL Final   Albumin 49/67/5916 3.9  3.5 - 5.0 g/dL Final   AST 38/46/6599 188 (A) 15 - 41 U/L Final   ALT 05/20/2021 157 (A) 0 -  44 U/L Final   Alkaline Phosphatase 05/20/2021 76  38 - 126 U/L Final   Total Bilirubin 05/20/2021 0.6  0.3 - 1.2 mg/dL Final   GFR, Estimated 05/20/2021 >60  >60 mL/min Final   Comment: (NOTE) Calculated using the CKD-EPI Creatinine Equation (2021)    Anion gap 05/20/2021 4 (A) 5 - 15  Final   Performed at [facility], [address] [place], [place] [address]   Alcohol, Ethyl (B) 05/20/2021 <10  <10 mg/dL Final   Comment: (NOTE) Lowest detectable limit for serum alcohol is 10 mg/dL.  For medical purposes only. Performed at [facility], [address] [place], [place] [address]    WBC 05/20/2021 8.3  4.0 - 10.5 K/uL Final   RBC 05/20/2021 4.50  3.87 - 5.11 MIL/uL Final   Hemoglobin 05/20/2021 13.0  12.0 - 15.0 g/dL Final   HCT 61/95/0932 40.2  36.0 - 46.0 % Final   MCV 05/20/2021 89.3  80.0 - 100.0 fL Final   MCH 05/20/2021 28.9  26.0 - 34.0 pg Final   MCHC 05/20/2021 32.3  30.0 - 36.0 g/dL Final   RDW 67/07/4579 17.5 (A) 11.5 - 15.5 % Final   Platelets 05/20/2021 462 (A) 150 - 400 K/uL Final   nRBC 05/20/2021 0.0  0.0 - 0.2 % Final   Neutrophils Relative % 05/20/2021 31  % Final   Neutro Abs 05/20/2021 2.5  1.7 - 7.7 K/uL Final   Lymphocytes Relative 05/20/2021 46  % Final   Lymphs Abs 05/20/2021 3.9  0.7 - 4.0 K/uL Final   Monocytes Relative 05/20/2021 14  % Final   Monocytes Absolute 05/20/2021 1.1 (A) 0.1 - 1.0 K/uL Final   Eosinophils Relative 05/20/2021 8  % Final   Eosinophils Absolute 05/20/2021 0.7 (A) 0.0 - 0.5 K/uL Final   Basophils Relative 05/20/2021 1  % Final   Basophils Absolute 05/20/2021 0.1  0.0 - 0.1 K/uL Final   Immature Granulocytes 05/20/2021 0  % Final   Abs Immature Granulocytes 05/20/2021 0.02  0.00 - 0.07 K/uL Final   Performed at [facility], [address], [place], [place] [address]   I-stat hCG, quantitative 05/20/2021 <5.0  <5 mIU/mL Final   Comment 3 05/20/2021          Final    Comment:   GEST. AGE      CONC.  (mIU/mL)   <=1 WEEK        5 - 50     2 WEEKS       50 - 500     3 WEEKS       100 - 10,000     4 WEEKS     1,000 - 30,000        FEMALE AND NON-PREGNANT FEMALE:     LESS THAN 5 mIU/mL    SARS Coronavirus 2 by RT PCR 05/20/2021 NEGATIVE  NEGATIVE Final   Comment: (NOTE) SARS-CoV-2 target nucleic acids are NOT DETECTED.  The SARS-CoV-2 RNA is generally detectable in upper respiratory specimens during the acute phase of infection. The lowest concentration of SARS-CoV-2 viral copies this assay can detect is 138 copies/mL. A negative result does not preclude SARS-Cov-2 infection and should not be used as the sole basis for treatment or other patient management decisions. A negative result may occur with  improper specimen collection/handling, submission of specimen other than nasopharyngeal swab, presence of viral mutation(s) within the areas targeted by this assay, and inadequate number of viral copies(<138 copies/mL). A negative result must be combined with clinical observations, patient history, and epidemiological information. The expected result is Negative.  Fact Sheet for Patients:  BloggerCourse.com  Fact Sheet for Healthcare Providers:  SeriousBroker.it  This test is no  t yet approved or cleared by the [place] and  has been authorized for detection and/or diagnosis of SARS-CoV-2 by FDA under an Emergency Use Authorization (EUA). This EUA will remain  in effect (meaning this test can be used) for the duration of the COVID-19 declaration under Section 564(b)(1) of the Act, 21 U.S.C.section 360bbb-3(b)(1), unless the authorization is terminated  or revoked sooner.       Influenza A by PCR 05/20/2021 NEGATIVE  NEGATIVE Final   Influenza B by PCR 05/20/2021 NEGATIVE  NEGATIVE Final   Comment: (NOTE) The Xpert Xpress SARS-CoV-2/FLU/RSV plus assay is intended  as an aid in the diagnosis of influenza from Nasopharyngeal swab specimens and should not be used as a sole basis for treatment. Nasal washings and aspirates are unacceptable for Xpert Xpress SARS-CoV-2/FLU/RSV testing.  Fact Sheet for Patients: BloggerCourse.com  Fact Sheet for Healthcare Providers: SeriousBroker.it  This test is not yet approved or cleared by the [place] FDA and has been authorized for detection and/or diagnosis of SARS-CoV-2 by FDA under an Emergency Use Authorization (EUA). This EUA will remain in effect (meaning this test can be used) for the duration of the COVID-19 declaration under Section 564(b)(1) of the Act, 21 U.S.C. section 360bbb-3(b)(1), unless the authorization is terminated or revoked.  Performed at [facility], [address], [place], [place] [address]     Blood Alcohol level:  Lab Results  Component Value Date   ETH <10 05/20/2021    Metabolic Disorder Labs: No results found for: HGBA1C, MPG No results found for: PROLACTIN No results found for: CHOL, TRIG, HDL, CHOLHDL, VLDL, LDLCALC  Therapeutic Lab Levels: No results found for: LITHIUM No results found for: VALPROATE No components found for:  CBMZ  Physical Findings   PHQ2-9    Flowsheet Row Office Visit from 10/15/2019 in [facility] for Infectious Disease  PHQ-2 Total Score 0      Flowsheet Row ED from 05/21/2021 in [facility] ED from 05/20/2021 in [facility]Y [facility]  C-SSRS RISK CATEGORY No Risk High Risk        Musculoskeletal  Strength & Muscle Tone: within normal limits Gait & Station: normal Patient leans: N/A  Psychiatric Specialty Exam  Presentation  General Appearance: Disheveled  Eye Contact:Fair  Speech:Clear and Coherent; Normal Rate  Speech Volume:Normal  Handedness:Right   Mood and Affect   Mood:Anxious; Irritable  Affect:Restricted   Thought Process  Thought Processes:Linear  Descriptions of Associations:Intact  Orientation:Full (Time, Place and Person)  Thought Content:Logical; Rumination  Diagnosis of Schizophrenia or Schizoaffective disorder in past: No    Hallucinations:Hallucinations: None  Ideas of Reference:None  Suicidal Thoughts:Suicidal Thoughts: No SI Passive Intent and/or Plan: Without Intent; Without Plan; Without Means to Carry Out  Homicidal Thoughts:Homicidal Thoughts: No   Sensorium  Memory:Immediate Fair; Recent Fair; Remote Fair  Judgment:Fair  Insight:Fair   Executive Functions  Concentration:Fair  Attention Span:Fair  Recall:Fair  Fund of Knowledge:Fair  Language:Fair   Psychomotor Activity  Psychomotor Activity:Psychomotor Activity: Normal   Assets  Assets:Desire for Improvement; Financial Resources/Insurance; Manufacturing systems engineer; Housing   Sleep  Sleep:Sleep: Fair   No data recorded  Physical Exam  @PHYSEXAM @ @ROS @ Blood pressure (!) 100/58, pulse 66, temperature (!) 97.3 F (36.3 C), temperature source Tympanic, resp. rate 14, SpO2 100 %. There is no height or weight on file to calculate BMI.  Treatment Plan Summary: Plan Continue current medications.  COntinue working with LCSW for appropriate  and safe disposition.  -Continue detox and COWS protocol.   Maryagnes Amos, FNP 05/22/2021 12:39 PM

## 2021-05-22 NOTE — ED Notes (Signed)
Pt in bed lying in bed, respirations even/unlabored, environment check complete/secure, will continue to monitor patient for safety

## 2021-05-22 NOTE — ED Notes (Signed)
Snacks given 

## 2021-05-22 NOTE — ED Notes (Signed)
Pt is lying in bed rocking back and forth stating her whole body hurts as she is withdrawing from IV fentanyl. This nurse advised the patient that all of the Medications for the pain have been administered to her. This nurse went and advised the provider on staff Gerrianne Scale) of how the patient is feeling. The provider came and spoke with the patient and advised she will be putting in some more orders for medications.

## 2021-05-23 DIAGNOSIS — F1914 Other psychoactive substance abuse with psychoactive substance-induced mood disorder: Secondary | ICD-10-CM | POA: Diagnosis not present

## 2021-05-23 DIAGNOSIS — F1994 Other psychoactive substance use, unspecified with psychoactive substance-induced mood disorder: Secondary | ICD-10-CM | POA: Diagnosis present

## 2021-05-23 DIAGNOSIS — F319 Bipolar disorder, unspecified: Secondary | ICD-10-CM | POA: Diagnosis not present

## 2021-05-23 DIAGNOSIS — F419 Anxiety disorder, unspecified: Secondary | ICD-10-CM | POA: Diagnosis not present

## 2021-05-23 DIAGNOSIS — T1491XA Suicide attempt, initial encounter: Secondary | ICD-10-CM | POA: Diagnosis not present

## 2021-05-23 NOTE — Progress Notes (Signed)
Pt's COWS was 9

## 2021-05-23 NOTE — Discharge Summary (Signed)
Sonia Warner to be D/C'd Home per NP order. Discussed with the patient and all questions fully answered. An After Visit Summary was printed and given to the patient. Patient escorted out and D/C home via private auto.  [name]  05/23/2021 11:50 AM

## 2021-05-23 NOTE — ED Notes (Signed)
Pt attended group but left early.

## 2021-05-23 NOTE — ED Notes (Signed)
Sleeping q-15 min checks maintained pt sleeping with no distress noted

## 2021-05-23 NOTE — ED Provider Notes (Signed)
FBC/OBS ASAP Discharge Summary  Date and Time: 05/23/2021 10:17 AM  Name: Sonia Warner  MRN:  [id number]   Discharge Diagnoses:  Final diagnoses:  Substance induced mood disorder (HCC)  Polysubstance abuse (HCC)    Subjective: "I was in the middle of a job and they don't know where I am.  I really need to get back to work." Sonia Warner, 45 y.o., female patient seen face to face by this provider, consulted with Dr. Nelly Rout; and chart reviewed on 05/23/21.  On evaluation Sonia Warner reports she is feeling better and needs to get back to work.  Patient reports that she is having no withdrawal symptoms other than feeling a little anxious.  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  Patient denies suicidal/self-harm/homicidal ideations, psychosis, paranoia.  Patient reporting she was in the middle of a crisis she had been doing fentanyl daily and intermittently meth, and was under the influence when she tried to harm herself.  Stating she is feeling better and is ready to go home.  Patient reports she does not have any outpatient psychiatric services but is interested in starting.  Discussed substance use services on an outpatient basis.  Discussed outpatient psychiatric services and substance use services options.  Informed we will give her resources. During evaluation [name] is sitting on side of her bed in no acute distress.  She is alert, oriented x 4, calm, cooperative and attentive.  Her mood is anxious but euthymic with congruent affect.  She has normal speech, and behavior.  Objectively there is no evidence of psychosis/mania or delusional thinking.  Patient is able to converse coherently, goal directed thoughts, no distractibility, or pre-occupation.  She also denies suicidal/self-harm/homicidal ideation, psychosis, and paranoia.  Patient answered question appropriately.     Stay  Summary: Sonia Warner was admitted to [facility] Facility Based Crisis Unit for Substance induced mood disorder [facility]) and crisis management.  She was treated with the following medications Clonidine detox protocol, Trileptal 75 mg twice daily, and Neurontin 100 mg twice daily.  Patient reported that medications were tolerated well with no adverse reactions.  No medications were continued upon discharged.   Smt. Rowen's improvement was monitored by continuous assessment/observation and her report of symptom reduction.  Her emotional and mental status was also monitored by staff.           Severa Jeremiah was evaluated for stability and plans for continued recovery upon discharge.  Horace Wishon motivation was an integral factor for scheduling further treatment.  The following was addressed as part of her discharge planning and follow up treatment:  Employment, housing, transportation, bed availability, health status, family support, and any pending legal issues were also considered during her during the continuous assessment/observation.  She was offered further treatment options upon discharge including but not limited to Residential, Intensive Outpatient, Outpatient treatment, Rehabilitation services, and other resources.  Sonia Warner will follow up with the services as listed below under Follow up Information.    Upon completion of this stay the Sonia Warner was both mentally and medically stable for discharge denying suicidal/homicidal ideation, auditory/visual/tactile hallucinations, delusional thoughts and paranoia.     Total Time spent with patient: 30 minutes  Past Psychiatric History: Depression, anxiety, ADD, opiate use disorder severe  Past Medical History:  Past Medical History:  Diagnosis Date   Collapse of left lung  Psoriasis     Past Surgical History:  Procedure Laterality Date   SPLENECTOMY     Family History:  Family History  Problem Relation Age of Onset    Hypertension Mother    Stroke Father    Family Psychiatric History: Denies Social History:  Social History   Substance and Sexual Activity  Alcohol Use Yes   Comment: 1-2  month / Occasional     Social History   Substance and Sexual Activity  Drug Use Yes   Types: Heroin, Methamphetamines   Comment: Heroin and methamphetamine     Social History   Socioeconomic History   Marital status: Divorced    Spouse name: Not on file   Number of children: Not on file   Years of education: Not on file   Highest education level: Not on file  Occupational History   Not on file  Tobacco Use   Smoking status: Unknown   Smokeless tobacco: Not on file  Vaping Use   Vaping Use: Some days  Substance and Sexual Activity   Alcohol use: Yes    Comment: 1-2  month / Occasional   Drug use: Yes    Types: Heroin, Methamphetamines    Comment: Heroin and methamphetamine    Sexual activity: Not on file  Other Topics Concern   Not on file  Social History Narrative   Not on file   Social Determinants of Health   Financial Resource Strain: Not on file  Food Insecurity: Not on file  Transportation Needs: Not on file  Physical Activity: Not on file  Stress: Not on file  Social Connections: Not on file   SDOH:  SDOH Screenings   Alcohol Screen: Not on file  Depression (PHQ2-9): Not on file  Financial Resource Strain: Not on file  Food Insecurity: Not on file  Housing: Not on file  Physical Activity: Not on file  Social Connections: Not on file  Stress: Not on file  Tobacco Use: Not on file  Transportation Needs: Not on file    Tobacco Cessation:  N/A, patient does not currently use tobacco products  Current Medications:  Current Facility-Administered Medications  Medication Dose Route Frequency Provider Last Rate Last Admin   cloNIDine (CATAPRES) tablet 0.1 mg  0.1 mg Oral BH-qamhs Starkes-Perry, Juel Burrow, FNP   0.1 mg at 05/23/21 0834   Followed by   Melene Muller ON 05/25/2021]  cloNIDine (CATAPRES) tablet 0.1 mg  0.1 mg Oral QAC breakfast Starkes-Perry, Juel Burrow, FNP       dicyclomine (BENTYL) tablet 20 mg  20 mg Oral Q6H PRN Maryagnes Amos, FNP   20 mg at 05/22/21 1540   gabapentin (NEURONTIN) capsule 100 mg  100 mg Oral BID Maryagnes Amos, FNP   100 mg at 05/23/21 9924   hydrOXYzine (ATARAX/VISTARIL) tablet 25 mg  25 mg Oral Q6H PRN Maryagnes Amos, FNP   25 mg at 05/23/21 2683   loperamide (IMODIUM) capsule 2-4 mg  2-4 mg Oral PRN Maryagnes Amos, FNP       methocarbamol (ROBAXIN) tablet 500 mg  500 mg Oral Q8H PRN Maryagnes Amos, FNP   500 mg at 05/23/21 0833   naproxen (NAPROSYN) tablet 500 mg  500 mg Oral BID PRN Maryagnes Amos, FNP   500 mg at 05/22/21 1540   ondansetron (ZOFRAN-ODT) disintegrating tablet 4 mg  4 mg Oral Q6H PRN Maryagnes Amos, FNP       OXcarbazepine (TRILEPTAL) tablet 75 mg  75 mg Oral BID Maryagnes Amos, FNP   75 mg at 05/23/21 6789   No current outpatient medications on file.    PTA Medications: (Not in a hospital admission)   Musculoskeletal  Strength & Muscle Tone: within normal limits Gait & Station: normal Patient leans: N/A  Psychiatric Specialty Exam  Presentation  General Appearance: Appropriate for Environment  Eye Contact:Good  Speech:Clear and Coherent; Normal Rate  Speech Volume:Normal  Handedness:Right   Mood and Affect  Mood:Anxious  Affect:Congruent   Thought Process  Thought Processes:Goal Directed  Descriptions of Associations:Intact  Orientation:Full (Time, Place and Person)  Thought Content:Logical; WDL  Diagnosis of Schizophrenia or Schizoaffective disorder in past: No    Hallucinations:Hallucinations: None  Ideas of Reference:None  Suicidal Thoughts:Suicidal Thoughts: No SI Passive Intent and/or Plan: Without Intent; Without Plan; Without Means to Carry Out  Homicidal Thoughts:Homicidal Thoughts: No   Sensorium   Memory:Immediate Good; Recent Good; Remote Good  Judgment:Intact  Insight:Present   Executive Functions  Concentration:Good  Attention Span:Good  Recall:Good  Fund of Knowledge:Good  Language:Good   Psychomotor Activity  Psychomotor Activity:Psychomotor Activity: Normal   Assets  Assets:Communication Skills; Desire for Improvement; Housing   Sleep  Sleep:Sleep: Fair (Improved)   Nutritional Assessment (For OBS and FBC admissions only) Has the patient had a weight loss or gain of 10 pounds or more in the last 3 months?: No Has the patient had a decrease in food intake/or appetite?: No Does the patient have dental problems?: No Does the patient have eating habits or behaviors that may be indicators of an eating disorder including binging or inducing vomiting?: No Has the patient recently lost weight without trying?: 0 Has the patient been eating poorly because of a decreased appetite?: 0 Malnutrition Screening Tool Score: 0    Physical Exam  Physical Exam Vitals and nursing note reviewed. Exam conducted with a chaperone present.  Constitutional:      General: She is not in acute distress.    Appearance: Normal appearance. She is not ill-appearing.  Cardiovascular:     Rate and Rhythm: Normal rate.  Pulmonary:     Effort: Pulmonary effort is normal.  Musculoskeletal:        General: Normal range of motion.     Cervical back: Normal range of motion.  Skin:    General: Skin is warm and dry.  Neurological:     Mental Status: She is alert and oriented to person, place, and time.  Psychiatric:        Attention and Perception: Attention and perception normal. She does not perceive auditory or visual hallucinations.        Mood and Affect: Affect normal. Mood is anxious.        Speech: Speech normal.        Behavior: Behavior normal. Behavior is cooperative.        Thought Content: Thought content normal. Thought content is not paranoid or delusional. Thought  content does not include homicidal or suicidal ideation.        Cognition and Memory: Cognition and memory normal.        Judgment: Judgment is impulsive.   Review of Systems  Constitutional: Negative.        Patient denies any with drawl symptoms at this time other than feeling a little anxious.  Reporting she is ready to go home because she has to get back to to work.   HENT: Negative.    Eyes: Negative.   Respiratory: Negative.  Cardiovascular: Negative.   Gastrointestinal: Negative.   Genitourinary: Negative.   Musculoskeletal: Negative.   Skin: Negative.   Neurological: Negative.   Endo/Heme/Allergies: Negative.   Psychiatric/Behavioral:  Positive for substance abuse (Fentanyl and meth). Depression: Stable. Hallucinations: Denies. Suicidal ideas: Denies.The patient is nervous/anxious (Stable). Insomnia: Improved.       Reporting under the influence of fentanyl when have suicidal thought and wanting to hang herself.  States she had gone through most of withdrawal and feels she can stay away from fentanyl and needs to get back to work.  Interested in outpatient psychiatric services.    Blood pressure 110/71, pulse 65, temperature 98.7 F (37.1 C), temperature source Oral, resp. rate 16, SpO2 100 %. There is no height or weight on file to calculate BMI.  Demographic Factors:  Caucasian and female  Loss Factors: None  Historical Factors: Impulsivity  Risk Reduction Factors:   Religious beliefs about death and Employed  Continued Clinical Symptoms:  Alcohol/Substance Abuse/Dependencies  Cognitive Features That Contribute To Risk:  None    Suicide Risk:  Minimal: No identifiable suicidal ideation.  Patients presenting with no risk factors but with morbid ruminations; may be classified as minimal risk based on the severity of the depressive symptoms  Plan Of Care/Follow-up recommendations:  Activity:  Resume regular activity   Follow-up Information     Services,  [facility].   Why: Admissions are currently completed Monday through Friday at 8am; both appointments and walk-ins are accepted.  Any individual that is a [facility] resident may present for a substance abuse screening and assessment for admission Contact information: [address] [place] [address] [phone]         [facility]   Specialty: Urgent Care Why: New Therapy walkin:  Monday-Wednesday from 7:30am-12:30pm.  New Medication management walkin Monday-Friday from 7:30 am to 11:00am.  Patient will be taken in the order that they come.  You may not be seen on the same day as walkin. first come first Designer, jewellery information: [address] [place] [address] [phone]        Call  [facility] [place] [employer].   Specialty: Pharmacist, hospital Why: schedule an appointment for medication management and therapy Contact information: [employer] of the [place] [address] [place] [address] [phone]         [facility].   Why: Walk in hours Monday thru Thursday 8:00 AM to 3:00 PM first come first serve Contact information: [address]  [address] [place] [address] [phone]                  Discharge Instructions      Substance Abuse Resources  [facility] Residential - Admissions are currently completed Monday through Friday at 8am; both appointments and walk-ins are accepted.  Any individual that is a [facility] resident may present for a substance abuse screening and assessment for admission.  A person may be referred by numerous sources or self-refer.   Potential clients will be screened for medical necessity and appropriateness for the program.  Clients must meet criteria for high-intensity residential treatment services.  If clinically appropriate, a client will continue with the comprehensive clinical assessment and intake process,  as well as enrollment in the [facility].   Address: [address] [place], [place] [address] Admin Hours: Mon-Fri 8AM to [facility] Hours: 24/7 Phone: [phone] Fax: [phone]   [facility] Recovery Services (  Detox) Facility Based Crisis:  These are 3 locations for services: Please call before arrival    Address: [address] [place], [place] [address] Phone: [phone]   Address: [address] [place], [place] [address] Phone#: [phone]   Address: [address] [place], [place] [address] Phone#: [phone]     [facility] ([facility]): (offers outpatient therapy and intensive outpatient substance abuse therapy).  [address], [place], [place] [address] Phone: [phone]   [facility]: Offers FREE recovery skills classes, support groups, 1:1 Peer Support, and Compeer Classes. [address], [place], [place] [address] Phone: [phone] (Call to complete intake).  [facility] [address] [place], [place] [address] Phone: [phone] ext: [phone] The [facility] provides food, shelter and other programs and services to the homeless men of [place] through our [employer].   By offering safe shelter, three meals a day, clean clothing, Biblical counseling, financial planning, vocational training, GED/education and employment assistance, we've helped mend the shattered lives of many homeless men since opening in 1974.   We have approximately 267 beds available, with a max of 312 beds including mats for emergency situations and currently house an average of 270 men a night.   Prospective Client Check-In Information Photo ID Required (State/ Out of State/ [facility]) - if photo ID is not available, clients are required to have a printout of a police/sheriff's criminal history report. Help out with chores around the [facility]. No sex offender of any type (pending,  charged, registered and/or any other sex related offenses) will be permitted to check in. Must be willing to abide by all rules, regulations, and policies established by the [employer]. The following will be provided - shelter, food, clothing, and biblical counseling. If you or someone you know is in need of assistance at our [facility] shelter in [place], [place], please call [phone] ext. [phone].   [facility]-will provide timely access to mental health services for children and adolescents (4-17) and adults presenting in a mental health crisis. The program is designed for those who need urgent Behavioral Health or Substance Use treatment and are not experiencing a medical crisis that would typically require an emergency room visit.    [address] [place], [place] [address] Phone: [phone] [email or web address]   [facility]: Phone#: [phone]   The [facility] Intensive Outpatient Program (SAIOP) means structured individual and group addiction activities and services that are provided at an outpatient program designed to assist adult and adolescent consumers to begin recovery and learn skills for recovery maintenance. The [facility] [facility] SAIOP program is offered at least 3 hours a day, 3 days a week.SAIOP services shall include a structured program consisting of, but not limited to, the following services: Individual counseling and support; Group counseling and support; Family counseling, training or support; Biochemical assays to identify recent drug use (e.g., urine drug screens); Strategies for relapse prevention to include community and social support systems in treatment; Life skills; Crisis contingency planning; Disease Management; and Treatment support activities that have been adapted or specifically designed for persons with physical disabilities, or persons with co-occurring disorders of mental illness and  substance abuse/dependence or mental retardation/developmental disability and substance abuse/dependence. Phone: [phone]     [facility] 24-Hour Call Center: [phone]  Behavioral Health Crisis Line: [phone]  Disposition: No evidence of imminent risk to self or others at present.   Patient does not meet criteria for psychiatric inpatient admission. Supportive therapy provided about ongoing stressors. Refer to IOP. Discussed crisis plan, support from social network, calling 911, coming to the Emergency Department, and calling Suicide Hotline.   Kourtnei Rauber, NP 05/23/2021, 10:17 AM

## 2021-05-23 NOTE — ED Notes (Signed)
Sonia Warner has remained asleep since shortly after receiving her 2200 medications no distress noted

## 2021-05-23 NOTE — ED Notes (Signed)
Pt eating breakfast 

## 2021-05-23 NOTE — Progress Notes (Signed)
Pt is awake, alert and oriented. Pt has been pacing and made multiple phone calls. Pt complained of restless legs. PRN Robaxin, Vistaril and scheduled meds administered with no incident. Pt requested to be discharged. Pt was informed that provider will be notified. Pt denies pain, current SI/HI/AVH. Staff will monitor for pt's safety.

## 2021-05-23 NOTE — Discharge Instructions (Signed)
Substance Abuse Resources  [facility] Residential - Admissions are currently completed Monday through Friday at 8am; both appointments and walk-ins are accepted.  Any individual that is a [place] resident may present for a substance abuse screening and assessment for admission.  A person may be referred by numerous sources or self-refer.   Potential clients will be screened for medical necessity and appropriateness for the program.  Clients must meet criteria for high-intensity residential treatment services.  If clinically appropriate, a client will continue with the comprehensive clinical assessment and intake process, as well as enrollment in the [facility].   Address: [address] [place], [place] [address] Admin Hours: Mon-Fri 8AM to 5PM Center Hours: 24/7 Phone: [phone] Fax: [phone]   [facility] (Detox) Facility Based Crisis:  These are 3 locations for services: Please call before arrival    Address: [address] [place], [place] [address] Phone: [phone]   Address: [address], [place], [place] [address] Phone#: [phone]   Address: [address] [place], [place], [place] [address] Phone#: [phone]     [facility] ([facility]): (offers outpatient therapy and intensive outpatient substance abuse therapy).  [address], [place], [place] [address] Phone: [phone]   [facility] [place]: Offers FREE recovery skills classes, support groups, 1:1 Peer Support, and Compeer Classes. [address], [place], [place] [address] Phone: [phone] (Call to complete intake).  [facility] [address] [place], [place] [address] Phone: [phone] ext: [phone] [facility] provides food, shelter and other programs and services to the homeless men of [place] through our men's program.   By offering safe shelter, three meals a day,  clean clothing, Biblical counseling, financial planning, vocational training, GED/education and employment assistance, we've helped mend the shattered lives of many homeless men since opening in 1974.   We have approximately 267 beds available, with a max of 312 beds including mats for emergency situations and currently house an average of 270 men a night.   Prospective Client Check-In Information Photo ID Required (State/ Out of State/ DOC) - if photo ID is not available, clients are required to have a printout of a police/sheriff's criminal history report. Help out with chores around the [facility]. No sex offender of any type (pending, charged, registered and/or any other sex related offenses) will be permitted to check in. Must be willing to abide by all rules, regulations, and policies established by the [facility]. The following will be provided - shelter, food, clothing, and biblical counseling. If you or someone you know is in need of assistance at our men's shelter in [place], [place], please call [phone] ext. [phone].   [facility]-will provide timely access to mental health services for children and adolescents (4-17) and adults presenting in a mental health crisis. The program is designed for those who need urgent Behavioral Health or Substance Use treatment and are not experiencing a medical crisis that would typically require an emergency room visit.    [address] [place],  [address] Phone: [phone] [email or web address]   [facility]: Phone#: [phone]   The [facility] Intensive Outpatient Program (SAIOP) means structured individual and group addiction activities and services that are provided at an outpatient program designed to assist adult and adolescent consumers to begin recovery and learn skills for recovery maintenance. The [facility] SAIOP program is offered at   least 3 hours a  day, 3 days a week.SAIOP services shall include a structured program consisting of, but not limited to, the following services: Individual counseling and support; Group counseling and support; Family counseling, training or support; Biochemical assays to identify recent drug use (e.g., urine drug screens); Strategies for relapse prevention to include community and social support systems in treatment; Life skills; Crisis contingency planning; Disease Management; and Treatment support activities that have been adapted or specifically designed for persons with physical disabilities, or persons with co-occurring disorders of mental illness and substance abuse/dependence or mental retardation/developmental disability and substance abuse/dependence. Phone: [phone]     [facility] 24-Hour Call Center: [phone]  Behavioral Health Crisis Line: [phone]       

## 2021-07-01 ENCOUNTER — Emergency Department (HOSPITAL_COMMUNITY)
Admission: EM | Admit: 2021-07-01 | Discharge: 2021-07-02 | Disposition: A | Discharge status: Home / Self Care | Payer: Self-pay | Attending: Emergency Medicine | Admitting: Emergency Medicine

## 2021-07-01 ENCOUNTER — Encounter (HOSPITAL_COMMUNITY): Payer: Self-pay

## 2021-07-01 ENCOUNTER — Other Ambulatory Visit: Payer: Self-pay

## 2021-07-01 DIAGNOSIS — N939 Abnormal uterine and vaginal bleeding, unspecified: Secondary | ICD-10-CM | POA: Insufficient documentation

## 2021-07-01 DIAGNOSIS — X58XXXA Exposure to other specified factors, initial encounter: Secondary | ICD-10-CM | POA: Insufficient documentation

## 2021-07-01 DIAGNOSIS — T192XXA Foreign body in vulva and vagina, initial encounter: Secondary | ICD-10-CM | POA: Insufficient documentation

## 2021-07-01 DIAGNOSIS — N9489 Other specified conditions associated with female genital organs and menstrual cycle: Secondary | ICD-10-CM | POA: Insufficient documentation

## 2021-07-01 NOTE — ED Notes (Signed)
Unable to collect istat hcg

## 2021-07-01 NOTE — ED Triage Notes (Signed)
Pt presents with c/o vaginal bleeding. Pt reports she just started her cycle and there is a very foul odor. Pt unsure as to whether she left a tampon in her vagina from her last cycle.

## 2021-07-01 NOTE — ED Provider Notes (Signed)
Emergency Medicine Provider Triage Evaluation Note  Sonia Warner , a 45 y.o. female  was evaluated in triage.  Pt complains of vaginal bleeding and foul odor. Denies vaginal discharge.  Review of Systems  Positive: Vaginal bleeding Negative: Vaginal discharge, fever  Physical Exam  There were no vitals taken for this visit. Gen:   Awake, no distress   Resp:  Normal effort  MSK:   Moves extremities without difficulty   Medical Decision Making  Medically screening exam initiated at 6:08 PM.  Appropriate orders placed.  Quida Glasser was informed that the remainder of the evaluation will be completed by another provider, this initial triage assessment does not replace that evaluation, and the importance of remaining in the ED until their evaluation is complete.     Rayne Du 07/01/21 Gwyndolyn Saxon, MD 07/03/21 [phone]

## 2021-07-02 ENCOUNTER — Encounter (HOSPITAL_COMMUNITY): Payer: Self-pay

## 2021-07-02 LAB — HIV ANTIBODY (ROUTINE TESTING W REFLEX): HIV Screen 4th Generation wRfx: NONREACTIVE

## 2021-07-02 LAB — GC/CHLAMYDIA PROBE AMP (~~LOC~~) NOT AT ARMC
Chlamydia: NEGATIVE
Comment: NEGATIVE
Comment: NORMAL
Neisseria Gonorrhea: NEGATIVE

## 2021-07-02 LAB — WET PREP, GENITAL
Sperm: NONE SEEN
Trich, Wet Prep: NONE SEEN
WBC, Wet Prep HPF POC: <10 (ref <10)
Yeast Wet Prep HPF POC: NONE SEEN

## 2021-07-02 LAB — URINALYSIS, ROUTINE W REFLEX MICROSCOPIC
Bilirubin Urine: NEGATIVE
Glucose, UA: NEGATIVE mg/dL
Ketones, ur: NEGATIVE mg/dL
Nitrite: POSITIVE — AB
Protein, ur: NEGATIVE mg/dL
Specific Gravity, Urine: 1.02 (ref 1.005–1.030)
pH: 6 (ref 5.0–8.0)

## 2021-07-02 LAB — I-STAT BETA HCG BLOOD, ED (MC, WL, AP ONLY): I-stat hCG, quantitative: <5 m[IU]/mL (ref <5)

## 2021-07-02 LAB — RPR: RPR Ser Ql: NONREACTIVE

## 2021-07-02 NOTE — ED Provider Notes (Signed)
[facility]-EMERGENCY DEPT Provider Note   CSN: [id number] Arrival date & time: 07/01/21  1749     History Chief Complaint  Patient presents with   Vaginal Bleeding   Foreign Body in Vagina    Sonia Warner is a 45 y.o. female.  Vaginal bleeding with foul odor , irregular menses, no clots. Bleeding started about 4 days ago and has been heavy. It does not follow her menstrual cycle. No history of irregular bleeding. She has noticed a very foul odor with the bleeding and is concerned for foreign body, as this has happened in the past with a forgotten tampon. No fever, abdominal pain, nausea/vomiting.   The history is provided by the patient. No language interpreter was used.  Vaginal Bleeding Associated symptoms: no abdominal pain, no back pain, no fever and no vaginal discharge   Foreign Body in Vagina Pertinent negatives include no abdominal pain.      Past Medical History:  Diagnosis Date   Collapse of left lung    Psoriasis     Patient Active Problem List   Diagnosis Date Noted   Substance induced mood disorder (HCC) 05/23/2021   Chronic hepatitis C without hepatic coma (HCC) 09/17/2019   Polysubstance abuse (HCC) 09/17/2019    Past Surgical History:  Procedure Laterality Date   SPLENECTOMY       OB History   No obstetric history on file.     Family History  Problem Relation Age of Onset   Hypertension Mother    Stroke Father     Social History   Tobacco Use   Smoking status: Unknown  Vaping Use   Vaping Use: Some days  Substance Use Topics   Alcohol use: Yes    Comment: 1-2  month / Occasional   Drug use: Yes    Types: Heroin, Methamphetamines    Comment: Heroin and methamphetamine     Home Medications Prior to Admission medications   Not on File    Allergies    Patient has no known allergies.  Review of Systems   Review of Systems  Constitutional:  Negative for fever.  Respiratory: Negative.    Cardiovascular:  Negative.   Gastrointestinal: Negative.  Negative for abdominal pain.  Genitourinary:  Positive for vaginal bleeding. Negative for vaginal discharge.       See HPI.  Musculoskeletal:  Negative for back pain and myalgias.   Physical Exam Updated Vital Signs BP 115/74   Pulse 80   Temp 98.8 F (37.1 C) (Oral)   Resp 16   SpO2 100%   Physical Exam Vitals and nursing note reviewed.  Constitutional:      General: She is not in acute distress.    Appearance: She is well-developed. She is not ill-appearing.  Pulmonary:     Effort: Pulmonary effort is normal.  Abdominal:     General: There is no distension.     Palpations: Abdomen is soft.     Tenderness: There is no abdominal tenderness.  Genitourinary:    Comments: Active vaginal bleeding making visualization of vaginal vault limited. Given this limitation, no foreign body is seen.  No CMT. No adnexal tenderness.  Musculoskeletal:        General: Normal range of motion.     Cervical back: Normal range of motion.  Skin:    General: Skin is warm and dry.  Neurological:     Mental Status: She is alert and oriented to person, place, and time.  ED Results / Procedures / Treatments   Labs (all labs ordered are listed, but only abnormal results are displayed) Labs Reviewed  WET PREP, GENITAL  URINALYSIS, ROUTINE W REFLEX MICROSCOPIC  HIV ANTIBODY (ROUTINE TESTING W REFLEX)  RPR  I-STAT BETA HCG BLOOD, ED ([facility], [facility], [facility] ONLY)  GC/CHLAMYDIA PROBE AMP ([place]) NOT AT [facility]   Results for orders placed or performed during the hospital encounter of 07/01/21  Wet prep, genital   Specimen: Cervix  Result Value Ref Range   Yeast Wet Prep HPF POC NONE SEEN NONE SEEN   Trich, Wet Prep NONE SEEN NONE SEEN   Clue Cells Wet Prep HPF POC PRESENT (A) NONE SEEN   WBC, Wet Prep HPF POC <10 <10   Sperm NONE SEEN   Urinalysis, Routine w reflex microscopic  Result Value Ref Range   Color, Urine YELLOW YELLOW   APPearance CLEAR CLEAR    Specific Gravity, Urine 1.020 1.005 - 1.030   pH 6.0 5.0 - 8.0   Glucose, UA NEGATIVE NEGATIVE mg/dL   Hgb urine dipstick MODERATE (A) NEGATIVE   Bilirubin Urine NEGATIVE NEGATIVE   Ketones, ur NEGATIVE NEGATIVE mg/dL   Protein, ur NEGATIVE NEGATIVE mg/dL   Nitrite POSITIVE (A) NEGATIVE   Leukocytes,Ua TRACE (A) NEGATIVE   RBC / HPF 21-50 0 - 5 RBC/hpf   WBC, UA 11-20 0 - 5 WBC/hpf   Bacteria, UA MANY (A) NONE SEEN   Squamous Epithelial / LPF 0-5 0 - 5   Mucus PRESENT   HIV Antibody (routine testing w rflx)  Result Value Ref Range   HIV Screen 4th Generation wRfx Non Reactive Non Reactive  RPR  Result Value Ref Range   RPR Ser Ql NON REACTIVE NON REACTIVE  I-Stat beta hCG blood, ED  Result Value Ref Range   I-stat hCG, quantitative <5.0 <5 mIU/mL   Comment 3          GC/Chlamydia probe amp () not at [facility]  Result Value Ref Range   Neisseria Gonorrhea Negative    Chlamydia Negative    Comment Normal Reference Ranger Chlamydia - Negative    Comment      Normal Reference Range Neisseria Gonorrhea - Negative     EKG None  Radiology No results found.  Procedures Procedures   Medications Ordered in ED Medications - No data to display  ED Course  I have reviewed the triage vital signs and the nursing notes.  Pertinent labs & imaging results that were available during my care of the patient were reviewed by me and considered in my medical decision making (see chart for details).    MDM Rules/Calculators/A&P                           Patient to ED with ss/sxs as per HPI.   She is well appearing, in NAD. VSS. No evidence of anemia and considered hemodynamically stable. Exam does not show any foreign body but visualization is limited.   Labs pending, including pregnancy, wet prep, GC/chlamydia. She has been in the ED for several hours. Discussed being able to be discharged and obtaining the labs in MyChart, which the patient prefers. Discussed if pregnancy  positive, to return to ED immediately. She doubts pregnancy.    ADDENDUM: 6/17 - Labs reviewed and show evidence BV. A message was sent to the patient and Flagyl Rx was called to a pharmacy and the patient made aware of  pharmacy location. Encouraged to follow up with GYN as planned.  Final Clinical Impression(s) / ED Diagnoses Final diagnoses:  None   Irregular vaginal bleeding  Rx / DC Orders ED Discharge Orders     None        Elpidio Anis, PA-C 07/02/21 2353    Geoffery Lyons, MD 07/07/21 2314

## 2021-07-02 NOTE — Discharge Instructions (Addendum)
Follow up with [facility] or a gynecologist of your choice for further outpatient evaluation of irregular bleeding.   Return to the ED with any new or worsening symptoms.

## 2023-05-17 DIAGNOSIS — Z419 Encounter for procedure for purposes other than remedying health state, unspecified: Secondary | ICD-10-CM | POA: Diagnosis not present

## 2023-06-17 DIAGNOSIS — Z419 Encounter for procedure for purposes other than remedying health state, unspecified: Secondary | ICD-10-CM | POA: Diagnosis not present

## 2023-07-17 DIAGNOSIS — Z419 Encounter for procedure for purposes other than remedying health state, unspecified: Secondary | ICD-10-CM | POA: Diagnosis not present

## 2023-08-17 DIAGNOSIS — Z419 Encounter for procedure for purposes other than remedying health state, unspecified: Secondary | ICD-10-CM | POA: Diagnosis not present

## 2023-09-17 DIAGNOSIS — Z419 Encounter for procedure for purposes other than remedying health state, unspecified: Secondary | ICD-10-CM | POA: Diagnosis not present

## 2023-10-15 DIAGNOSIS — Z419 Encounter for procedure for purposes other than remedying health state, unspecified: Secondary | ICD-10-CM | POA: Diagnosis not present

## 2023-11-23 ENCOUNTER — Ambulatory Visit: Payer: Self-pay | Admitting: Family Medicine

## 2023-11-26 DIAGNOSIS — Z419 Encounter for procedure for purposes other than remedying health state, unspecified: Secondary | ICD-10-CM | POA: Diagnosis not present

## 2023-12-26 DIAGNOSIS — Z419 Encounter for procedure for purposes other than remedying health state, unspecified: Secondary | ICD-10-CM | POA: Diagnosis not present

## 2024-01-26 DIAGNOSIS — Z419 Encounter for procedure for purposes other than remedying health state, unspecified: Secondary | ICD-10-CM | POA: Diagnosis not present

## 2024-01-27 ENCOUNTER — Ambulatory Visit: Payer: Self-pay | Admitting: Family Medicine

## 2024-01-27 ENCOUNTER — Telehealth: Payer: Self-pay

## 2024-01-27 ENCOUNTER — Ambulatory Visit: Admitting: Family Medicine

## 2024-01-27 VITALS — BP 120/80 | HR 84 | Temp 98.6°F | Ht 67.0 in | Wt 150.0 lb

## 2024-01-27 DIAGNOSIS — N3000 Acute cystitis without hematuria: Secondary | ICD-10-CM

## 2024-01-27 DIAGNOSIS — R300 Dysuria: Secondary | ICD-10-CM | POA: Diagnosis not present

## 2024-01-27 DIAGNOSIS — F1910 Other psychoactive substance abuse, uncomplicated: Secondary | ICD-10-CM | POA: Diagnosis not present

## 2024-01-27 DIAGNOSIS — Z7689 Persons encountering health services in other specified circumstances: Secondary | ICD-10-CM

## 2024-01-27 DIAGNOSIS — J349 Unspecified disorder of nose and nasal sinuses: Secondary | ICD-10-CM

## 2024-01-27 DIAGNOSIS — K089 Disorder of teeth and supporting structures, unspecified: Secondary | ICD-10-CM

## 2024-01-27 DIAGNOSIS — Z1211 Encounter for screening for malignant neoplasm of colon: Secondary | ICD-10-CM | POA: Diagnosis not present

## 2024-01-27 LAB — POCT URINALYSIS DIP (CLINITEK)
Bilirubin, UA: NEGATIVE
Blood, UA: NEGATIVE
Glucose, UA: NEGATIVE mg/dL
Ketones, POC UA: NEGATIVE mg/dL
Nitrite, UA: POSITIVE — AB
POC PROTEIN,UA: NEGATIVE
Spec Grav, UA: >=1.03 — AB (ref 1.010–1.025)
Urobilinogen, UA: 4 U/dL — AB
pH, UA: 5.5 (ref 5.0–8.0)

## 2024-01-27 MED ORDER — NITROFURANTOIN MONOHYD MACRO 100 MG PO CAPS: 100.0000 mg | ORAL_CAPSULE | Freq: Two times a day (BID) | ORAL | 0 refills | Status: AC

## 2024-01-27 NOTE — Progress Notes (Signed)
 I,Sonia Warner, CMA,acting as a Neurosurgeon for [employer], NP.,have documented all relevant documentation on the behalf of Sonia Spry, NP,as directed by  Sonia Spry, NP while in the presence of Sonia Spry, NP.  Subjective:  Patient ID: Sonia Warner , female    DOB: April 12, 1976 , 48 y.o.   MRN: [id number]  Chief Complaint  Patient presents with   Establish Care   Dysuria   Sinus Problem    HPI  Patient is a 48 year female who presents today to establish care. 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 She wants help to stop, asked her if she would like to try a suboxone cline and she said yes.  Will refer her to [facility]. Patient also reports some burning with urination, urgency, some episodes of incontinence. She has no concerns for STDs.        Past Medical History:  Diagnosis Date   Collapse of left lung    Psoriasis      Family History  Problem Relation Age of Onset   Hypertension Mother    Stroke Father      Current Outpatient Medications:    nitrofurantoin, macrocrystal-monohydrate, (MACROBID) 100 MG capsule, Take 1 capsule (100 mg total) by mouth 2 (two) times daily for 5 days., Disp: 10 capsule, Rfl: 0   No Known Allergies   Review of Systems  Constitutional: Negative.   HENT:  Positive for dental problem and rhinorrhea.   Respiratory: Negative.    Cardiovascular: Negative.   Genitourinary: Negative.   Musculoskeletal: Negative.   Skin: Negative.   Neurological: Negative.   Psychiatric/Behavioral:  The patient is nervous/anxious.      Today's Vitals   01/27/24 0912 01/27/24 1001  BP: 120/80   Pulse: (!) 109 84  Temp: 98.6 F (37 C)   SpO2: 98%   Weight: 150 lb (68 kg)   Height: 5' 7 (1.702 m)    Body mass index is 23.49 kg/m.  Wt  Readings from Last 3 Encounters:  01/27/24 150 lb (68 kg)  01/08/20 123 lb (55.8 kg)  09/17/19 133 lb (60.3 kg)    The ASCVD Risk score (Arnett DK, et al., 2019) failed to calculate for the following reasons:   Cannot find a previous HDL lab   Cannot find a previous total cholesterol lab  Objective:  Physical Exam HENT:     Head: Normocephalic.   Cardiovascular:     Rate and Rhythm: Regular rhythm. Tachycardia present.  Pulmonary:     Effort: Pulmonary effort is normal.   Neurological:     Mental Status: She is alert.         Assessment And Plan:  Establishing care with new doctor, encounter for  Dysuria -     POCT URINALYSIS DIP (CLINITEK)  Polysubstance abuse (HCC) -     AMB Referral VBCI Care Management -     Ambulatory referral to Pain Clinic  Sinus disease -     Ambulatory referral to ENT  Dental disease -     Ambulatory referral to Dentistry -     CMP14+EGFR -     CBC with Differential/Platelet  Screening for colon cancer -     Ambulatory referral to Gastroenterology  Acute cystitis without hematuria -  Nitrofurantoin Monohyd Macro; Take 1 capsule (100 mg total) by mouth 2 (two) times daily for 5 days.  Dispense: 10 capsule; Refill: 0    Return for 3 mon for physical .  Patient was given opportunity to ask questions. Patient verbalized understanding of the plan and was able to repeat key elements of the plan. All questions were answered to their satisfaction.    I, Sonia Spry, NP, have reviewed all documentation for this visit. The documentation on 01/27/2024 for the exam, diagnosis, procedures, and orders are all accurate and complete.    IF YOU HAVE BEEN REFERRED TO A SPECIALIST, IT MAY TAKE 1-2 WEEKS TO SCHEDULE/PROCESS THE REFERRAL. IF YOU HAVE NOT HEARD FROM US /SPECIALIST IN TWO WEEKS, PLEASE GIVE US  A CALL AT [phone] X [phone].

## 2024-01-27 NOTE — Progress Notes (Signed)
   Telephone encounter was:  Unsuccessful.  01/27/2024 Name: Sonia Warner MRN: [id number] DOB: 01-Apr-1976  Unsuccessful outbound call made today to assist with:  Food Insecurity and Financial Difficulties related to Financial strain  [employer] Attempt:  1st Attempt  Unable to leave a message   Azell Leopard [facility]  [facility] Guide, Phone: [phone] Fax: [phone] Website: [email or web address]

## 2024-01-27 NOTE — Progress Notes (Unsigned)
 Complex Care Management Note Care Guide Note  01/27/2024 Name: Sonia Warner MRN: [id number] DOB: 1975/09/15   Complex Care Management Outreach Attempts: An unsuccessful telephone outreach was attempted today to offer the patient information about available complex care management services.  Follow Up Plan:  Additional outreach attempts will be made to offer the patient complex care management information and services.   Encounter Outcome:  No Answer  Creola Doheny [facility], [facility] Guide  Direct Dial: [phone]  Fax [phone]

## 2024-01-28 LAB — CBC WITH DIFFERENTIAL/PLATELET
Basophils Absolute: 0.1 10*3/uL (ref 0.0–0.2)
Basos: 2 %
EOS (ABSOLUTE): 0.6 10*3/uL — ABNORMAL HIGH (ref 0.0–0.4)
Eos: 9 %
Hematocrit: 34.3 % (ref 34.0–46.6)
Hemoglobin: 11.2 g/dL (ref 11.1–15.9)
Immature Grans (Abs): 0 10*3/uL (ref 0.0–0.1)
Immature Granulocytes: 0 %
Lymphocytes Absolute: 3.8 10*3/uL — ABNORMAL HIGH (ref 0.7–3.1)
Lymphs: 54 %
MCH: 29.1 pg (ref 26.6–33.0)
MCHC: 32.7 g/dL (ref 31.5–35.7)
MCV: 89 fL (ref 79–97)
Monocytes Absolute: 1.4 10*3/uL — ABNORMAL HIGH (ref 0.1–0.9)
Monocytes: 20 %
Neutrophils Absolute: 1 10*3/uL — ABNORMAL LOW (ref 1.4–7.0)
Neutrophils: 15 %
Platelets: 339 10*3/uL (ref 150–450)
RBC: 3.85 x10E6/uL (ref 3.77–5.28)
RDW: 13.8 % (ref 11.7–15.4)
WBC: 6.9 10*3/uL (ref 3.4–10.8)

## 2024-01-28 LAB — CMP14+EGFR
ALT: 235 IU/L — ABNORMAL HIGH (ref 0–32)
AST: 323 IU/L — ABNORMAL HIGH (ref 0–40)
Albumin: 3.7 g/dL — ABNORMAL LOW (ref 3.9–4.9)
Alkaline Phosphatase: 120 IU/L (ref 44–121)
BUN/Creatinine Ratio: 27 — ABNORMAL HIGH (ref 9–23)
BUN: 17 mg/dL (ref 6–24)
Bilirubin Total: 0.5 mg/dL (ref 0.0–1.2)
CO2: 24 mmol/L (ref 20–29)
Calcium: 9.3 mg/dL (ref 8.7–10.2)
Chloride: 101 mmol/L (ref 96–106)
Creatinine, Ser: 0.63 mg/dL (ref 0.57–1.00)
Globulin, Total: 3.9 g/dL (ref 1.5–4.5)
Glucose: 86 mg/dL (ref 70–99)
Potassium: 4.2 mmol/L (ref 3.5–5.2)
Sodium: 138 mmol/L (ref 134–144)
Total Protein: 7.6 g/dL (ref 6.0–8.5)
eGFR: 108 mL/min/{1.73_m2} (ref >59)

## 2024-01-30 NOTE — Progress Notes (Unsigned)
 Complex Care Management Note Care Guide Note  01/30/2024 Name: Nyelli Samara MRN: [id number] DOB: [birth year]   Complex Care Management Outreach Attempts: A second unsuccessful outreach was attempted today to offer the patient with information about available complex care management services.  Follow Up Plan:  Additional outreach attempts will be made to offer the patient complex care management information and services.   Encounter Outcome:  No Answer  Creola Doheny [facility], [facility] Guide  Direct Dial: [phone]  Fax [phone]

## 2024-01-31 ENCOUNTER — Telehealth: Payer: Self-pay

## 2024-01-31 NOTE — Progress Notes (Signed)
   Telephone encounter was:  Successful.  Complex Care Management Note Care Guide Note  01/31/2024 Name: Sonia Warner MRN: [id number] DOB: 18-Dec-1975  Sonia Warner is a 48 y.o. year old female who is a primary care patient of Melodie Spry, NP . The community resource team was consulted for assistance with Financial Difficulties related to financial strain  SDOH screenings and interventions completed:  Yes        Care guide performed the following interventions: Patient provided with information about care guide support team and interviewed to confirm resource needs.Patient requested online [employer] for [employer] and financial assistance. I was able to give [email or web address] and [email or web address]   Follow Up Plan:  No further follow up planned at this time. The patient has been provided with needed resources.  Encounter Outcome:  Patient Visit Completed    Sonia Warner [facility]  [facility] Guide, Phone: [phone] Fax: [phone] Website: [email or web address]

## 2024-01-31 NOTE — Progress Notes (Signed)
 Complex Care Management Note Care Guide Note  01/31/2024 Name: Sonia Warner MRN: [id number] DOB: January 22, 1976   Complex Care Management Outreach Attempts: A third unsuccessful outreach was attempted today to offer the patient with information about available complex care management services.  Follow Up Plan:  No further outreach attempts will be made at this time. We have been unable to contact the patient to offer or enroll patient in complex care management services.  Encounter Outcome:  No Answer  Creola Doheny [facility], [facility] Guide  Direct Dial: [phone]  Fax [phone]

## 2024-02-06 ENCOUNTER — Encounter (INDEPENDENT_AMBULATORY_CARE_PROVIDER_SITE_OTHER): Payer: Self-pay

## 2024-02-25 DIAGNOSIS — Z419 Encounter for procedure for purposes other than remedying health state, unspecified: Secondary | ICD-10-CM | POA: Diagnosis not present

## 2024-03-27 DIAGNOSIS — Z419 Encounter for procedure for purposes other than remedying health state, unspecified: Secondary | ICD-10-CM | POA: Diagnosis not present

## 2024-04-19 ENCOUNTER — Encounter (INDEPENDENT_AMBULATORY_CARE_PROVIDER_SITE_OTHER): Payer: Self-pay | Admitting: Otolaryngology

## 2024-04-19 ENCOUNTER — Ambulatory Visit (INDEPENDENT_AMBULATORY_CARE_PROVIDER_SITE_OTHER): Payer: Self-pay | Admitting: Otolaryngology

## 2024-04-19 VITALS — BP 105/70 | HR 93 | Ht 67.0 in

## 2024-04-19 DIAGNOSIS — J343 Hypertrophy of nasal turbinates: Secondary | ICD-10-CM

## 2024-04-19 DIAGNOSIS — J342 Deviated nasal septum: Secondary | ICD-10-CM | POA: Diagnosis not present

## 2024-04-19 DIAGNOSIS — R0981 Nasal congestion: Secondary | ICD-10-CM

## 2024-04-19 DIAGNOSIS — J328 Other chronic sinusitis: Secondary | ICD-10-CM | POA: Diagnosis not present

## 2024-04-19 MED ORDER — PREDNISONE 10 MG PO TABS: ORAL_TABLET | ORAL | 0 refills | Status: AC

## 2024-04-19 MED ORDER — AMOXICILLIN-POT CLAVULANATE 875-125 MG PO TABS
1.0000 | ORAL_TABLET | Freq: Two times a day (BID) | ORAL | 0 refills | Status: AC
Start: 2024-04-19 — End: 2024-04-29

## 2024-04-19 MED ORDER — FLUTICASONE PROPIONATE 50 MCG/ACT NA SUSP: 2.0000 | Freq: Two times a day (BID) | NASAL | 6 refills | Status: AC

## 2024-04-19 NOTE — Progress Notes (Signed)
 Dear Dr. Petrina, Here is my assessment for our mutual patient, Sonia Warner. Thank you for allowing me the opportunity to care for your patient. Please do not hesitate to contact me should you have any other questions. Sincerely, Dr. Eldora Blanch  Otolaryngology Clinic Note  HISTORY: Sonia Warner is a 48 y.o. female kindly referred by Dr. Petrina for evaluation of sinusitis  Initial visit (04/2024): She reports no history of sinus issues until she had relapsed and started to sniff fentanyl (about 3 years ago) and then started to have issues once she had COVID in Jan/Feb. Reports there is a foul smell and will have some nasal dripping. Occurs bilaterally, but right is worse. Associated symptoms also include facial pressure bilaterally and some discolored drainage. She has stopped using fentanyl intranasally but still using.  She is currently on Claritin which is helping a fair amount. She has not had any antibiotics for this. No steroids. Has not tried any sprays except inhaling oregano. She also has some dental issues which she feels like are contributing  Allergy testing has not been done. Denies typical AR symptoms. No previous sinonasal surgery.  AP/AC: no  Tobacco: denies  PMHx: Polysubstance use, Hep C, Psoriasis  RADIOGRAPHIC EVALUATION AND INDEPENDENT REVIEW OF OTHER RECORDS: CBC and CMP 01/27/2024: WBC 6.8, Eos 600; BUN/Cr 17/0.63 CT Face 09/30/2018 independently interpreted with respect to sinuses: left septal deviation, modest left ethmoid opacification and floor of frontal sinus, left concha, left max mucous retention cyst; right max MPT mild, and trace right ethmoid MPT Bruna Petrina (01/27/2024): sniffing fentanyl, then had sinus issues, so now smoking it multiple times per day. Dx: Sinus disease, ref t oENT  Past Medical History:  Diagnosis Date   Collapse of left lung    Psoriasis    Past Surgical History:  Procedure Laterality Date   SPLENECTOMY     Family History   Problem Relation Age of Onset   Hypertension Mother    Stroke Father    Social History   Tobacco Use   Smoking status: Some Days    Current packs/day: 0.25    Average packs/day: 0.3 packs/day for 30.0 years (7.5 ttl pk-yrs)    Types: Cigarettes   Smokeless tobacco: Not on file  Substance Use Topics   Alcohol use: Not Currently    Comment: 1-2  month / Occasional   No Known Allergies No current outpatient medications on file.   No current facility-administered medications for this visit.   BP 105/70 (BP Location: Left Arm, Patient Position: Sitting, Cuff Size: Normal)   Pulse 93   Ht 5' 7 (1.702 m)   SpO2 95%   BMI 23.49 kg/m   PHYSICAL EXAM:  BP 105/70 (BP Location: Left Arm, Patient Position: Sitting, Cuff Size: Normal)   Pulse 93   Ht 5' 7 (1.702 m)   SpO2 95%   BMI 23.49 kg/m    Salient findings:  CN II-XII intact Bilateral EAC clear and TM intact with well pneumatized middle ear spaces Nose: Anterior rhinoscopy reveals septum mild dev left, bilateral inferior turbinate hypertrophy; some foul smelling drainage.  Nasal endoscopy was indicated to better evaluate the nose and paranasal sinuses, given the patient's history and exam findings, and is detailed below. No lesions of oral cavity/oropharynx; dentition poor No obviously palpable neck masses/lymphadenopathy/thyromegaly No respiratory distress or stridor   PROCEDURE:  Prior to initiating any procedures, risks/benefits/alternatives were explained to the patient and verbal consent obtained. Diagnostic Nasal Endoscopy Pre-procedure diagnosis: Concern for chronic  sinusitis Post-procedure diagnosis: same Indication: See pre-procedure diagnosis and physical exam above Complications: None apparent EBL: 0 mL Anesthesia: Lidocaine  4% and topical decongestant was topically sprayed in each nasal cavity  Description of Procedure:  Patient was identified. A rigid 30 degree endoscope was utilized to evaluate the  sinonasal cavities, mucosa, sinus ostia and turbinates and septum.  Overall, signs of mucosal inflammation are noted - global mucosal edema, moderately severe.  No polyps, or masses noted.   Right Middle meatus: purulence Right SE Recess: thick mucoid secretions Left MM: no purulence noted but thick mucoid secretions Left SE Recess: clear  CPT CODE -- 31231 - Mod 25   ASSESSMENT:  48 y.o.  1. Other chronic sinusitis   2. Nasal congestion   3. Nasal septal deviation   4. Hypertrophy of both inferior nasal turbinates    Likely chronic sinusitis in setting of intranasal substance use and poor dentition with purulence on endo.  PLAN: We've discussed issues and options today.  We reviewed the nasal endoscopy images together.  The risks, benefits and alternatives were discussed and questions answered.  She has elected to proceed with:  1) Augmentin  BID x10d 2) Prednisone  daily taper as below for 10d 2) Flonase  BID until follow up 2) Follow-up in 6 weeks -- sooner as necessary.  See below regarding exact medications prescribed this encounter including dosages and route: No orders of the defined types were placed in this encounter.    Thank you for allowing me the opportunity to care for your patient. Please do not hesitate to contact me should you have any other questions.  Sincerely, Eldora Blanch, MD Otolaryngologist (ENT), [facility] Phone: [phone] Fax: [phone]  MDM:  Level 4 : [phone] Complexity/Problems addressed: mod - now a chronic problem with exacerbation Data complexity: mod - independent interpretation of CT - Morbidity: mod  - Prescription Drug prescribed or managed: y  04/19/2024, 1:26 PM

## 2024-04-19 NOTE — Patient Instructions (Signed)
   Use two sprays of flonase  in each nostril twice per day Take Augmentin  875 mg by mouth (PO) twice daily for 10 days; take with food, take probiotic or yogurt with it Take Prednisone  by mouth (PO)  20mg  for 5 days (2 pills), then 10mg  for 5 days (1 pill), then stop. Risks discussed  Use a neti pot with distilled water daily

## 2024-04-27 DIAGNOSIS — Z419 Encounter for procedure for purposes other than remedying health state, unspecified: Secondary | ICD-10-CM | POA: Diagnosis not present

## 2024-05-03 ENCOUNTER — Ambulatory Visit: Admitting: Family Medicine

## 2024-05-03 ENCOUNTER — Encounter: Payer: Self-pay | Admitting: Family Medicine

## 2024-05-03 VITALS — BP 114/70 | HR 91 | Temp 98.6°F | Ht 67.0 in | Wt 153.0 lb

## 2024-05-03 DIAGNOSIS — Z136 Encounter for screening for cardiovascular disorders: Secondary | ICD-10-CM

## 2024-05-03 DIAGNOSIS — J349 Unspecified disorder of nose and nasal sinuses: Secondary | ICD-10-CM | POA: Diagnosis not present

## 2024-05-03 DIAGNOSIS — L308 Other specified dermatitis: Secondary | ICD-10-CM

## 2024-05-03 DIAGNOSIS — F331 Major depressive disorder, recurrent, moderate: Secondary | ICD-10-CM

## 2024-05-03 DIAGNOSIS — Z1231 Encounter for screening mammogram for malignant neoplasm of breast: Secondary | ICD-10-CM | POA: Diagnosis not present

## 2024-05-03 DIAGNOSIS — K089 Disorder of teeth and supporting structures, unspecified: Secondary | ICD-10-CM

## 2024-05-03 DIAGNOSIS — Z5320 Procedure and treatment not carried out because of patient's decision for unspecified reasons: Secondary | ICD-10-CM

## 2024-05-03 DIAGNOSIS — N926 Irregular menstruation, unspecified: Secondary | ICD-10-CM | POA: Diagnosis not present

## 2024-05-03 DIAGNOSIS — Z780 Asymptomatic menopausal state: Secondary | ICD-10-CM

## 2024-05-03 DIAGNOSIS — F1910 Other psychoactive substance abuse, uncomplicated: Secondary | ICD-10-CM

## 2024-05-03 DIAGNOSIS — B182 Chronic viral hepatitis C: Secondary | ICD-10-CM

## 2024-05-03 DIAGNOSIS — Z0000 Encounter for general adult medical examination without abnormal findings: Secondary | ICD-10-CM | POA: Diagnosis not present

## 2024-05-03 DIAGNOSIS — F1994 Other psychoactive substance use, unspecified with psychoactive substance-induced mood disorder: Secondary | ICD-10-CM

## 2024-05-03 DIAGNOSIS — Z2821 Immunization not carried out because of patient refusal: Secondary | ICD-10-CM

## 2024-05-03 MED ORDER — CLOBETASOL PROPIONATE 0.05 % EX CREA: 1.0000 | TOPICAL_CREAM | Freq: Two times a day (BID) | CUTANEOUS | 1 refills | Status: AC

## 2024-05-03 MED ORDER — FLUOXETINE HCL 20 MG PO CAPS: 20.0000 mg | ORAL_CAPSULE | Freq: Every day | ORAL | 2 refills | Status: AC
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
## 2024-05-04 ENCOUNTER — Encounter: Payer: Self-pay | Admitting: Family Medicine

## 2024-05-04 LAB — CBC
Hematocrit: 35.4 % (ref 34.0–46.6)
Hemoglobin: 11.6 g/dL (ref 11.1–15.9)
MCH: 29.1 pg (ref 26.6–33.0)
MCHC: 32.8 g/dL (ref 31.5–35.7)
MCV: 89 fL (ref 79–97)
Platelets: 305 x10E3/uL (ref 150–450)
RBC: 3.99 x10E6/uL (ref 3.77–5.28)
RDW: 15.4 % (ref 11.7–15.4)
WBC: 6.8 x10E3/uL (ref 3.4–10.8)

## 2024-05-04 LAB — CMP14+EGFR
ALT: 173 IU/L — ABNORMAL HIGH (ref 0–32)
AST: 230 IU/L — ABNORMAL HIGH (ref 0–40)
Albumin: 3.3 g/dL — ABNORMAL LOW (ref 3.9–4.9)
Alkaline Phosphatase: 120 IU/L — ABNORMAL HIGH (ref 41–116)
BUN/Creatinine Ratio: 22 (ref 9–23)
BUN: 14 mg/dL (ref 6–24)
Bilirubin Total: 0.5 mg/dL (ref 0.0–1.2)
CO2: 20 mmol/L (ref 20–29)
Calcium: 9.1 mg/dL (ref 8.7–10.2)
Chloride: 104 mmol/L (ref 96–106)
Creatinine, Ser: 0.65 mg/dL (ref 0.57–1.00)
Globulin, Total: 3.7 g/dL (ref 1.5–4.5)
Glucose: 76 mg/dL (ref 70–99)
Potassium: 3.9 mmol/L (ref 3.5–5.2)
Sodium: 137 mmol/L (ref 134–144)
Total Protein: 7 g/dL (ref 6.0–8.5)
eGFR: 107 mL/min/1.73 (ref >59)

## 2024-05-04 LAB — LIPID PANEL
Chol/HDL Ratio: 2.4 ratio (ref 0.0–4.4)
Cholesterol, Total: 112 mg/dL (ref 100–199)
HDL: 46 mg/dL (ref >39)
LDL Chol Calc (NIH): 50 mg/dL (ref 0–99)
Triglycerides: 78 mg/dL (ref 0–149)
VLDL Cholesterol Cal: 16 mg/dL (ref 5–40)

## 2024-05-07 DIAGNOSIS — Z3200 Encounter for pregnancy test, result unknown: Secondary | ICD-10-CM | POA: Diagnosis not present

## 2024-05-07 DIAGNOSIS — R0602 Shortness of breath: Secondary | ICD-10-CM | POA: Diagnosis not present

## 2024-05-07 DIAGNOSIS — M129 Arthropathy, unspecified: Secondary | ICD-10-CM | POA: Diagnosis not present

## 2024-05-07 DIAGNOSIS — Z79899 Other long term (current) drug therapy: Secondary | ICD-10-CM | POA: Diagnosis not present

## 2024-05-08 ENCOUNTER — Ambulatory Visit (INDEPENDENT_AMBULATORY_CARE_PROVIDER_SITE_OTHER): Admitting: Licensed Clinical Social Worker

## 2024-05-08 ENCOUNTER — Telehealth: Payer: Self-pay | Admitting: Licensed Clinical Social Worker

## 2024-05-08 DIAGNOSIS — Z91199 Patient's noncompliance with other medical treatment and regimen due to unspecified reason: Secondary | ICD-10-CM

## 2024-05-08 NOTE — Telephone Encounter (Signed)
 [facility] Collaborative Care Clinician attempted to connect with patient for scheduled virtual appointment. Patient did not show up for the appointment despite text x 2 and email x 2. After waiting 15 minutes, clinician attempted to reach pt by phone to offer phone visit. Pt did not answer phone and clinician left HIPAA compliant voicemail for pt to call office to reschedule appointment.   Per [facility] policy visit will be coded as no show

## 2024-05-08 NOTE — BH Specialist Note (Signed)
 [facility] Collaborative Care Clinician attempted to connect with patient for scheduled appointment in office. Patient did not show up for the appointment. After waiting 15 minutes, clinician attempted to reach pt by phone to offer virtual visit. Pt did not answer phone and clinician left HIPAA compliant voicemail for pt to call office to reschedule appointment.   Per [facility] policy visit will be coded as no show

## 2024-05-11 DIAGNOSIS — R7401 Elevation of levels of liver transaminase levels: Secondary | ICD-10-CM | POA: Diagnosis not present

## 2024-05-11 DIAGNOSIS — Z7251 High risk heterosexual behavior: Secondary | ICD-10-CM | POA: Diagnosis not present

## 2024-05-11 DIAGNOSIS — Z3200 Encounter for pregnancy test, result unknown: Secondary | ICD-10-CM | POA: Diagnosis not present

## 2024-05-11 DIAGNOSIS — Z79899 Other long term (current) drug therapy: Secondary | ICD-10-CM | POA: Diagnosis not present

## 2024-05-11 DIAGNOSIS — R0602 Shortness of breath: Secondary | ICD-10-CM | POA: Diagnosis not present

## 2024-05-11 DIAGNOSIS — Z114 Encounter for screening for human immunodeficiency virus [HIV]: Secondary | ICD-10-CM | POA: Diagnosis not present

## 2024-05-11 DIAGNOSIS — E559 Vitamin D deficiency, unspecified: Secondary | ICD-10-CM | POA: Diagnosis not present

## 2024-05-14 ENCOUNTER — Ambulatory Visit: Payer: Self-pay | Admitting: Family Medicine

## 2024-05-14 DIAGNOSIS — Z2821 Immunization not carried out because of patient refusal: Secondary | ICD-10-CM | POA: Insufficient documentation

## 2024-05-14 DIAGNOSIS — N926 Irregular menstruation, unspecified: Secondary | ICD-10-CM | POA: Insufficient documentation

## 2024-05-14 DIAGNOSIS — Z1231 Encounter for screening mammogram for malignant neoplasm of breast: Secondary | ICD-10-CM | POA: Insufficient documentation

## 2024-05-14 DIAGNOSIS — L308 Other specified dermatitis: Secondary | ICD-10-CM | POA: Insufficient documentation

## 2024-05-14 DIAGNOSIS — Z5320 Procedure and treatment not carried out because of patient's decision for unspecified reasons: Secondary | ICD-10-CM | POA: Insufficient documentation

## 2024-05-14 DIAGNOSIS — R7401 Elevation of levels of liver transaminase levels: Secondary | ICD-10-CM | POA: Diagnosis not present

## 2024-05-14 DIAGNOSIS — F331 Major depressive disorder, recurrent, moderate: Secondary | ICD-10-CM | POA: Insufficient documentation

## 2024-05-14 NOTE — Assessment & Plan Note (Signed)
 Dental issues may be exacerbated by substance use. She has contacted the dentist. - Encourage follow-up with dentist.

## 2024-05-14 NOTE — Assessment & Plan Note (Signed)
 Previous treatment with steroids and Flonase  showed improvement. Did not complete steroid course. - Continue using Flonase  as prescribed by ENT. - Follow up with ENT as scheduled.

## 2024-05-14 NOTE — Assessment & Plan Note (Signed)
 Continues fentanyl and occasional methamphetamine use. Desires to quit and plans to contact [facility]. Discussed substance use impact on health. Friend's treatment may motivate her. - Refer to counselor for substance use disorder counseling.

## 2024-05-14 NOTE — Assessment & Plan Note (Signed)
 Psoriasis improved with steroid use, but treatment was incomplete due to loss of medication. - Prescribe topical cream for psoriasis.

## 2024-05-14 NOTE — Assessment & Plan Note (Signed)
 Irregular cycles with recent onset of period. Possible perimenopausal symptoms. - Refer to GYN for evaluation of irregular menstruation and perimenopausal symptoms

## 2024-05-14 NOTE — Progress Notes (Signed)
 Your albumin is slightly low, hope you are eating enough protein?Your liver enzymes are still elevated, better than last time but still elevated.  Have you contacted [facility] center yet so that you can start the suboxone treatment? Do you need me to send a referral?  Let me know   Thanks

## 2024-05-14 NOTE — Assessment & Plan Note (Signed)
 Reports symptoms of depression, possibly contributing to substance use. - Prescribe medication for depression. - Refer to counselor for mental health support

## 2024-05-25 ENCOUNTER — Encounter

## 2024-05-27 DIAGNOSIS — Z419 Encounter for procedure for purposes other than remedying health state, unspecified: Secondary | ICD-10-CM | POA: Diagnosis not present

## 2024-05-28 DIAGNOSIS — Z79899 Other long term (current) drug therapy: Secondary | ICD-10-CM | POA: Diagnosis not present

## 2024-05-28 DIAGNOSIS — B1920 Unspecified viral hepatitis C without hepatic coma: Secondary | ICD-10-CM | POA: Diagnosis not present

## 2024-05-28 DIAGNOSIS — Z3200 Encounter for pregnancy test, result unknown: Secondary | ICD-10-CM | POA: Diagnosis not present

## 2024-05-28 DIAGNOSIS — B009 Herpesviral infection, unspecified: Secondary | ICD-10-CM | POA: Diagnosis not present

## 2024-05-31 DIAGNOSIS — R7401 Elevation of levels of liver transaminase levels: Secondary | ICD-10-CM | POA: Diagnosis not present

## 2024-05-31 DIAGNOSIS — Z1211 Encounter for screening for malignant neoplasm of colon: Secondary | ICD-10-CM | POA: Diagnosis not present

## 2024-05-31 DIAGNOSIS — B1920 Unspecified viral hepatitis C without hepatic coma: Secondary | ICD-10-CM | POA: Diagnosis not present

## 2024-06-07 DIAGNOSIS — R451 Restlessness and agitation: Secondary | ICD-10-CM | POA: Diagnosis not present

## 2024-06-07 DIAGNOSIS — Z3200 Encounter for pregnancy test, result unknown: Secondary | ICD-10-CM | POA: Diagnosis not present

## 2024-06-07 DIAGNOSIS — Z79899 Other long term (current) drug therapy: Secondary | ICD-10-CM | POA: Diagnosis not present

## 2024-06-13 DIAGNOSIS — R030 Elevated blood-pressure reading, without diagnosis of hypertension: Secondary | ICD-10-CM | POA: Diagnosis not present

## 2024-06-13 DIAGNOSIS — Z3200 Encounter for pregnancy test, result unknown: Secondary | ICD-10-CM | POA: Diagnosis not present

## 2024-06-13 DIAGNOSIS — R451 Restlessness and agitation: Secondary | ICD-10-CM | POA: Diagnosis not present

## 2024-06-13 DIAGNOSIS — Z79899 Other long term (current) drug therapy: Secondary | ICD-10-CM | POA: Diagnosis not present

## 2024-06-18 ENCOUNTER — Encounter (INDEPENDENT_AMBULATORY_CARE_PROVIDER_SITE_OTHER): Payer: Self-pay | Admitting: Otolaryngology

## 2024-06-18 ENCOUNTER — Ambulatory Visit (INDEPENDENT_AMBULATORY_CARE_PROVIDER_SITE_OTHER): Admitting: Otolaryngology

## 2024-06-18 VITALS — BP 118/79 | HR 90 | Ht 67.0 in | Wt 153.0 lb

## 2024-06-18 DIAGNOSIS — F17210 Nicotine dependence, cigarettes, uncomplicated: Secondary | ICD-10-CM | POA: Diagnosis not present

## 2024-06-18 DIAGNOSIS — J343 Hypertrophy of nasal turbinates: Secondary | ICD-10-CM

## 2024-06-18 DIAGNOSIS — J342 Deviated nasal septum: Secondary | ICD-10-CM

## 2024-06-18 DIAGNOSIS — J328 Other chronic sinusitis: Secondary | ICD-10-CM

## 2024-06-18 DIAGNOSIS — R0981 Nasal congestion: Secondary | ICD-10-CM

## 2024-06-18 NOTE — Progress Notes (Signed)
 Dear Dr. Petrina, Here is my assessment for our mutual patient, Matasha Smigelski. Thank you for allowing me the opportunity to care for your patient. Please do not hesitate to contact me should you have any other questions. Sincerely, Dr. Eldora Blanch  Otolaryngology Clinic Note  HISTORY: Sonia Warner is a 48 y.o. female kindly referred by Dr. Petrina for evaluation of sinusitis  Initial visit (04/2024): She reports no history of sinus issues until she had relapsed and started to sniff fentanyl (about 3 years ago) and then started to have issues once she had COVID in Jan/Feb. Reports there is a foul smell and will have some nasal dripping. Occurs bilaterally, but right is worse. Associated symptoms also include facial pressure bilaterally and some discolored drainage. She has stopped using fentanyl intranasally but still using.  She is currently on Claritin which is helping a fair amount. She has not had any antibiotics for this. No steroids. Has not tried any sprays except inhaling oregano. She also has some dental issues which she feels like are contributing  Allergy testing has not been done. Denies typical AR symptoms. No previous sinonasal surgery. --------------------------------------------------------- 06/18/2024 Finished abx, only did 5 days of steroids; used flonase  for several weeks but now doing PRN. Doing much better - no facial pain/pressure, no discolored drainage. Not using rinses. She reports she stopped using the fentanyl   AP/AC: no  Tobacco: denies  PMHx: Polysubstance use, Hep C, Psoriasis  RADIOGRAPHIC EVALUATION AND INDEPENDENT REVIEW OF OTHER RECORDS: CBC and CMP 01/27/2024: WBC 6.8, Eos 600; BUN/Cr 17/0.63 CT Face 09/30/2018 independently interpreted with respect to sinuses: left septal deviation, modest left ethmoid opacification and floor of frontal sinus, left concha, left max mucous retention cyst; right max MPT mild, and trace right ethmoid MPT Bruna Petrina  (01/27/2024): sniffing fentanyl, then had sinus issues, so now smoking it multiple times per day. Dx: Sinus disease, ref t oENT  Past Medical History:  Diagnosis Date   Collapse of left lung    Psoriasis    Past Surgical History:  Procedure Laterality Date   SPLENECTOMY     Family History  Problem Relation Age of Onset   Hypertension Mother    Stroke Father    Social History   Tobacco Use   Smoking status: Some Days    Current packs/day: 0.25    Average packs/day: 0.3 packs/day for 30.0 years (7.5 ttl pk-yrs)    Types: Cigarettes   Smokeless tobacco: Not on file  Substance Use Topics   Alcohol use: Not Currently    Comment: 1-2  month / Occasional   No Known Allergies Current Outpatient Medications  Medication Sig Dispense Refill   acyclovir cream (ZOVIRAX) 5 % Apply 1 Application topically 2 (two) times daily.     Buprenorphine HCl-Naloxone HCl 2-0.5 MG FILM Place under the tongue daily.     Buprenorphine HCl-Naloxone HCl 8-2 MG FILM Place under the tongue 2 (two) times daily.     clobetasol  cream (TEMOVATE ) 0.05 % Apply 1 Application topically 2 (two) times daily. 60 g 1   FLUoxetine  (PROZAC ) 20 MG capsule Take 1 capsule (20 mg total) by mouth daily. 90 capsule 2   fluticasone  (FLONASE ) 50 MCG/ACT nasal spray Place 2 sprays into both nostrils in the morning and at bedtime. 16 g 6   hydrOXYzine  (ATARAX ) 25 MG tablet SMARTSIG:1 Tablet(s) By Mouth Morning-Night PRN     Na Sulfate-K Sulfate-Mg Sulfate concentrate (SUPREP) 17.5-3.13-1.6 GM/177ML SOLN Take by mouth as directed.  valACYclovir (VALTREX) 1000 MG tablet Take 1,000 mg by mouth 2 (two) times daily.     No current facility-administered medications for this visit.   BP 118/79 (BP Location: Left Arm, Patient Position: Sitting, Cuff Size: Normal)   Pulse 90   Ht 5' 7 (1.702 m)   Wt 153 lb (69.4 kg)   SpO2 96%   BMI 23.96 kg/m   PHYSICAL EXAM:  BP 118/79 (BP Location: Left Arm, Patient Position: Sitting, Cuff  Size: Normal)   Pulse 90   Ht 5' 7 (1.702 m)   Wt 153 lb (69.4 kg)   SpO2 96%   BMI 23.96 kg/m    Salient findings:  CN II-XII intact Bilateral EAC clear and TM intact with well pneumatized middle ear spaces Nose: Anterior rhinoscopy reveals septum mild dev left, bilateral inferior turbinate hypertrophy; no foul smelling drainage today; Nasal endoscopy was indicated to better evaluate the nose and paranasal sinuses, given the patient's history and exam findings, and is detailed below. No lesions of oral cavity/oropharynx; dentition poor No obviously palpable neck masses/lymphadenopathy/thyromegaly No respiratory distress or stridor   PROCEDURE:  Prior to initiating any procedures, risks/benefits/alternatives were explained to the patient and verbal consent obtained. Diagnostic Nasal Endoscopy Pre-procedure diagnosis: Chronic sinusitis, assess response Post-procedure diagnosis: same Indication: See pre-procedure diagnosis and physical exam above Complications: None apparent EBL: 0 mL Anesthesia: Lidocaine  4% and topical decongestant was topically sprayed in each nasal cavity  Description of Procedure:  Patient was identified. A rigid 30 degree endoscope was utilized to evaluate the sinonasal cavities, mucosa, sinus ostia and turbinates and septum.  Overall, signs of mucosal inflammation are NOT noted today.  No polyps, or masses or mucopurulence noted. No septal perforation Right Middle meatus: clear Right SE Recess: clear Left MM: clear Left SE Recess: clear  CPT CODE -- 31231 - Mod 25   ASSESSMENT:  48 y.o.  1. Other chronic sinusitis   2. Nasal congestion   3. Nasal septal deviation   4. Hypertrophy of both inferior nasal turbinates    Likely chronic sinusitis in setting of intranasal substance use and poor dentition with purulence on endo. Now resolved after medical management, and she is doing well. Endo without purulence today  PLAN: We've discussed issues and  options today.  We reviewed the nasal endoscopy images together.  The risks, benefits and alternatives were discussed and questions answered.  She has elected to proceed with:  - Rec flonase  BID for another 4-6 weeks - Daily sinus rinse for 4-6 weeks - Does not have h/o chronic sinus issues so she'd like to f/u PRN  See below regarding exact medications prescribed this encounter including dosages and route: No orders of the defined types were placed in this encounter.    Thank you for allowing me the opportunity to care for your patient. Please do not hesitate to contact me should you have any other questions.  Sincerely, Eldora Blanch, MD Otolaryngologist (ENT), [facility] Phone: [phone] Fax: [phone]  MDM:  : [phone] Complexity/Problems addressed: low Data complexity: low - Morbidity: low  - Prescription Drug prescribed or managed: n  06/18/2024, 3:19 PM

## 2024-06-21 DIAGNOSIS — B1920 Unspecified viral hepatitis C without hepatic coma: Secondary | ICD-10-CM | POA: Diagnosis not present

## 2024-06-21 DIAGNOSIS — Z6824 Body mass index (BMI) 24.0-24.9, adult: Secondary | ICD-10-CM | POA: Diagnosis not present

## 2024-06-27 DIAGNOSIS — Z419 Encounter for procedure for purposes other than remedying health state, unspecified: Secondary | ICD-10-CM | POA: Diagnosis not present

## 2024-07-11 DIAGNOSIS — E559 Vitamin D deficiency, unspecified: Secondary | ICD-10-CM | POA: Diagnosis not present

## 2024-07-11 DIAGNOSIS — Z79899 Other long term (current) drug therapy: Secondary | ICD-10-CM | POA: Diagnosis not present

## 2024-07-11 DIAGNOSIS — R5383 Other fatigue: Secondary | ICD-10-CM | POA: Diagnosis not present

## 2024-07-11 DIAGNOSIS — E039 Hypothyroidism, unspecified: Secondary | ICD-10-CM | POA: Diagnosis not present

## 2024-07-27 DIAGNOSIS — Z419 Encounter for procedure for purposes other than remedying health state, unspecified: Secondary | ICD-10-CM | POA: Diagnosis not present

## 2024-08-15 ENCOUNTER — Encounter

## 2024-11-23 ENCOUNTER — Ambulatory Visit

## 2025-05-08 ENCOUNTER — Encounter: Payer: Self-pay | Admitting: Family Medicine
